# Patient Record
Sex: Male | Born: 1980 | Race: White | Hispanic: No | Smoking: Former smoker
Health system: Southern US, Community
[De-identification: ages and names within clinical notes are randomized; demographics above are authoritative.]

## PROBLEM LIST (undated history)

## (undated) DIAGNOSIS — F1010 Alcohol abuse, uncomplicated: Secondary | ICD-10-CM

## (undated) HISTORY — PX: SHOULDER ARTHROSCOPY: SHX128

---

## 2020-06-20 ENCOUNTER — Inpatient Hospital Stay (HOSPITAL_COMMUNITY): Payer: Self-pay

## 2020-06-20 ENCOUNTER — Inpatient Hospital Stay (HOSPITAL_COMMUNITY)
Admission: EM | Admit: 2020-06-20 | Discharge: 2020-06-30 | DRG: 082 | Disposition: A | Discharge status: Home / Self Care | Payer: Self-pay | Attending: Internal Medicine | Admitting: Internal Medicine

## 2020-06-20 ENCOUNTER — Emergency Department (HOSPITAL_COMMUNITY): Payer: Self-pay

## 2020-06-20 DIAGNOSIS — Y92481 Parking lot as the place of occurrence of the external cause: Secondary | ICD-10-CM

## 2020-06-20 DIAGNOSIS — K2101 Gastro-esophageal reflux disease with esophagitis, with bleeding: Secondary | ICD-10-CM | POA: Diagnosis present

## 2020-06-20 DIAGNOSIS — J9811 Atelectasis: Secondary | ICD-10-CM | POA: Diagnosis present

## 2020-06-20 DIAGNOSIS — D684 Acquired coagulation factor deficiency: Secondary | ICD-10-CM | POA: Diagnosis present

## 2020-06-20 DIAGNOSIS — Z781 Physical restraint status: Secondary | ICD-10-CM

## 2020-06-20 DIAGNOSIS — K7031 Alcoholic cirrhosis of liver with ascites: Secondary | ICD-10-CM | POA: Diagnosis present

## 2020-06-20 DIAGNOSIS — I864 Gastric varices: Secondary | ICD-10-CM | POA: Diagnosis present

## 2020-06-20 DIAGNOSIS — R402362 Coma scale, best motor response, obeys commands, at arrival to emergency department: Secondary | ICD-10-CM | POA: Diagnosis present

## 2020-06-20 DIAGNOSIS — Z720 Tobacco use: Secondary | ICD-10-CM

## 2020-06-20 DIAGNOSIS — Z79899 Other long term (current) drug therapy: Secondary | ICD-10-CM

## 2020-06-20 DIAGNOSIS — K2971 Gastritis, unspecified, with bleeding: Secondary | ICD-10-CM | POA: Diagnosis present

## 2020-06-20 DIAGNOSIS — Y9301 Activity, walking, marching and hiking: Secondary | ICD-10-CM | POA: Diagnosis present

## 2020-06-20 DIAGNOSIS — R791 Abnormal coagulation profile: Secondary | ICD-10-CM

## 2020-06-20 DIAGNOSIS — D61818 Other pancytopenia: Secondary | ICD-10-CM | POA: Diagnosis present

## 2020-06-20 DIAGNOSIS — K3189 Other diseases of stomach and duodenum: Secondary | ICD-10-CM | POA: Diagnosis present

## 2020-06-20 DIAGNOSIS — F10229 Alcohol dependence with intoxication, unspecified: Secondary | ICD-10-CM | POA: Diagnosis present

## 2020-06-20 DIAGNOSIS — W1839XA Other fall on same level, initial encounter: Secondary | ICD-10-CM | POA: Diagnosis present

## 2020-06-20 DIAGNOSIS — K7040 Alcoholic hepatic failure without coma: Secondary | ICD-10-CM | POA: Diagnosis present

## 2020-06-20 DIAGNOSIS — F10239 Alcohol dependence with withdrawal, unspecified: Secondary | ICD-10-CM | POA: Diagnosis not present

## 2020-06-20 DIAGNOSIS — R471 Dysarthria and anarthria: Secondary | ICD-10-CM | POA: Diagnosis present

## 2020-06-20 DIAGNOSIS — R17 Unspecified jaundice: Secondary | ICD-10-CM

## 2020-06-20 DIAGNOSIS — F10939 Alcohol use, unspecified with withdrawal, unspecified: Secondary | ICD-10-CM

## 2020-06-20 DIAGNOSIS — Z9181 History of falling: Secondary | ICD-10-CM

## 2020-06-20 DIAGNOSIS — R402252 Coma scale, best verbal response, oriented, at arrival to emergency department: Secondary | ICD-10-CM | POA: Diagnosis present

## 2020-06-20 DIAGNOSIS — R4189 Other symptoms and signs involving cognitive functions and awareness: Secondary | ICD-10-CM | POA: Diagnosis present

## 2020-06-20 DIAGNOSIS — R569 Unspecified convulsions: Secondary | ICD-10-CM

## 2020-06-20 DIAGNOSIS — K7011 Alcoholic hepatitis with ascites: Secondary | ICD-10-CM | POA: Diagnosis present

## 2020-06-20 DIAGNOSIS — F1010 Alcohol abuse, uncomplicated: Secondary | ICD-10-CM

## 2020-06-20 DIAGNOSIS — S06349A Traumatic hemorrhage of right cerebrum with loss of consciousness of unspecified duration, initial encounter: Principal | ICD-10-CM | POA: Diagnosis present

## 2020-06-20 DIAGNOSIS — Y900 Blood alcohol level of less than 20 mg/100 ml: Secondary | ICD-10-CM | POA: Diagnosis present

## 2020-06-20 DIAGNOSIS — R402142 Coma scale, eyes open, spontaneous, at arrival to emergency department: Secondary | ICD-10-CM | POA: Diagnosis present

## 2020-06-20 DIAGNOSIS — R278 Other lack of coordination: Secondary | ICD-10-CM | POA: Diagnosis present

## 2020-06-20 DIAGNOSIS — K7460 Unspecified cirrhosis of liver: Secondary | ICD-10-CM

## 2020-06-20 DIAGNOSIS — S61411A Laceration without foreign body of right hand, initial encounter: Secondary | ICD-10-CM | POA: Diagnosis present

## 2020-06-20 DIAGNOSIS — M549 Dorsalgia, unspecified: Secondary | ICD-10-CM | POA: Diagnosis present

## 2020-06-20 DIAGNOSIS — I8510 Secondary esophageal varices without bleeding: Secondary | ICD-10-CM | POA: Diagnosis present

## 2020-06-20 DIAGNOSIS — J9600 Acute respiratory failure, unspecified whether with hypoxia or hypercapnia: Secondary | ICD-10-CM | POA: Diagnosis not present

## 2020-06-20 DIAGNOSIS — J9601 Acute respiratory failure with hypoxia: Secondary | ICD-10-CM

## 2020-06-20 DIAGNOSIS — G9349 Other encephalopathy: Secondary | ICD-10-CM | POA: Diagnosis present

## 2020-06-20 DIAGNOSIS — S0101XA Laceration without foreign body of scalp, initial encounter: Secondary | ICD-10-CM | POA: Diagnosis present

## 2020-06-20 DIAGNOSIS — E876 Hypokalemia: Secondary | ICD-10-CM | POA: Diagnosis not present

## 2020-06-20 DIAGNOSIS — G4089 Other seizures: Secondary | ICD-10-CM | POA: Diagnosis present

## 2020-06-20 DIAGNOSIS — D62 Acute posthemorrhagic anemia: Secondary | ICD-10-CM

## 2020-06-20 DIAGNOSIS — K264 Chronic or unspecified duodenal ulcer with hemorrhage: Secondary | ICD-10-CM | POA: Diagnosis present

## 2020-06-20 DIAGNOSIS — I619 Nontraumatic intracerebral hemorrhage, unspecified: Secondary | ICD-10-CM | POA: Diagnosis present

## 2020-06-20 DIAGNOSIS — Z20822 Contact with and (suspected) exposure to covid-19: Secondary | ICD-10-CM | POA: Diagnosis present

## 2020-06-20 DIAGNOSIS — K7010 Alcoholic hepatitis without ascites: Secondary | ICD-10-CM

## 2020-06-20 DIAGNOSIS — I629 Nontraumatic intracranial hemorrhage, unspecified: Secondary | ICD-10-CM

## 2020-06-20 DIAGNOSIS — G9341 Metabolic encephalopathy: Secondary | ICD-10-CM | POA: Diagnosis present

## 2020-06-20 DIAGNOSIS — D696 Thrombocytopenia, unspecified: Secondary | ICD-10-CM

## 2020-06-20 DIAGNOSIS — Z87891 Personal history of nicotine dependence: Secondary | ICD-10-CM

## 2020-06-20 DIAGNOSIS — K766 Portal hypertension: Secondary | ICD-10-CM | POA: Diagnosis present

## 2020-06-20 DIAGNOSIS — E871 Hypo-osmolality and hyponatremia: Secondary | ICD-10-CM | POA: Diagnosis not present

## 2020-06-20 DIAGNOSIS — S062XAA Diffuse traumatic brain injury with loss of consciousness status unknown, initial encounter: Secondary | ICD-10-CM

## 2020-06-20 DIAGNOSIS — K269 Duodenal ulcer, unspecified as acute or chronic, without hemorrhage or perforation: Secondary | ICD-10-CM

## 2020-06-20 HISTORY — DX: Alcohol abuse, uncomplicated: F10.10

## 2020-06-20 LAB — URINALYSIS, ROUTINE W REFLEX MICROSCOPIC
Bilirubin Urine: NEGATIVE
Glucose, UA: NEGATIVE mg/dL
Ketones, ur: NEGATIVE mg/dL
Leukocytes,Ua: NEGATIVE
Nitrite: NEGATIVE
Protein, ur: 100 mg/dL — AB
Specific Gravity, Urine: 1.016 (ref 1.005–1.030)
pH: 5 (ref 5.0–8.0)

## 2020-06-20 LAB — COMPREHENSIVE METABOLIC PANEL
ALT: 27 U/L (ref 0–44)
AST: 156 U/L — ABNORMAL HIGH (ref 15–41)
Albumin: 3.1 g/dL — ABNORMAL LOW (ref 3.5–5.0)
Alkaline Phosphatase: 179 U/L — ABNORMAL HIGH (ref 38–126)
Anion gap: 25 — ABNORMAL HIGH (ref 5–15)
BUN: 6 mg/dL (ref 6–20)
CO2: 9 mmol/L — ABNORMAL LOW (ref 22–32)
Calcium: 8 mg/dL — ABNORMAL LOW (ref 8.9–10.3)
Chloride: 101 mmol/L (ref 98–111)
Creatinine, Ser: 1.14 mg/dL (ref 0.61–1.24)
GFR calc Af Amer: >60 mL/min (ref >60)
GFR calc non Af Amer: >60 mL/min (ref >60)
Glucose, Bld: 105 mg/dL — ABNORMAL HIGH (ref 70–99)
Potassium: 3.6 mmol/L (ref 3.5–5.1)
Sodium: 135 mmol/L (ref 135–145)
Total Bilirubin: 6.2 mg/dL — ABNORMAL HIGH (ref 0.3–1.2)
Total Protein: 7.2 g/dL (ref 6.5–8.1)

## 2020-06-20 LAB — PROTIME-INR
INR: 1.7 — ABNORMAL HIGH (ref 0.8–1.2)
Prothrombin Time: 19 seconds — ABNORMAL HIGH (ref 11.4–15.2)

## 2020-06-20 LAB — CBC WITH DIFFERENTIAL/PLATELET
Abs Immature Granulocytes: 0.21 10*3/uL — ABNORMAL HIGH (ref 0.00–0.07)
Basophils Absolute: 0.1 10*3/uL (ref 0.0–0.1)
Basophils Relative: 1 %
Eosinophils Absolute: 0.3 10*3/uL (ref 0.0–0.5)
Eosinophils Relative: 3 %
HCT: 30.5 % — ABNORMAL LOW (ref 39.0–52.0)
Hemoglobin: 8.6 g/dL — ABNORMAL LOW (ref 13.0–17.0)
Immature Granulocytes: 2 %
Lymphocytes Relative: 38 %
Lymphs Abs: 4.5 10*3/uL — ABNORMAL HIGH (ref 0.7–4.0)
MCH: 23.9 pg — ABNORMAL LOW (ref 26.0–34.0)
MCHC: 28.2 g/dL — ABNORMAL LOW (ref 30.0–36.0)
MCV: 84.7 fL (ref 80.0–100.0)
Monocytes Absolute: 1.5 10*3/uL — ABNORMAL HIGH (ref 0.1–1.0)
Monocytes Relative: 13 %
Neutro Abs: 5.2 10*3/uL (ref 1.7–7.7)
Neutrophils Relative %: 43 %
Platelets: 41 10*3/uL — ABNORMAL LOW (ref 150–400)
RBC: 3.6 MIL/uL — ABNORMAL LOW (ref 4.22–5.81)
RDW: 20.2 % — ABNORMAL HIGH (ref 11.5–15.5)
WBC: 11.8 10*3/uL — ABNORMAL HIGH (ref 4.0–10.5)
nRBC: 0.3 % — ABNORMAL HIGH (ref 0.0–0.2)

## 2020-06-20 LAB — RAPID URINE DRUG SCREEN, HOSP PERFORMED
Amphetamines: NOT DETECTED
Barbiturates: NOT DETECTED
Benzodiazepines: POSITIVE — AB
Cocaine: NOT DETECTED
Opiates: NOT DETECTED
Tetrahydrocannabinol: NOT DETECTED

## 2020-06-20 LAB — GLUCOSE, CAPILLARY
Glucose-Capillary: 105 mg/dL — ABNORMAL HIGH (ref 70–99)
Glucose-Capillary: 104 mg/dL — ABNORMAL HIGH (ref 70–99)

## 2020-06-20 LAB — SARS CORONAVIRUS 2 BY RT PCR (HOSPITAL ORDER, PERFORMED IN ~~LOC~~ HOSPITAL LAB): SARS Coronavirus 2: NEGATIVE

## 2020-06-20 LAB — ABO/RH: ABO/RH(D): O POS

## 2020-06-20 LAB — CBG MONITORING, ED: Glucose-Capillary: 97 mg/dL (ref 70–99)

## 2020-06-20 LAB — MRSA PCR SCREENING: MRSA by PCR: NEGATIVE

## 2020-06-20 LAB — ETHANOL: Alcohol, Ethyl (B): 15 mg/dL — ABNORMAL HIGH (ref <10)

## 2020-06-20 IMAGING — CT CT HEAD W/O CM
4 series · 15 of 47 positions shown, 17 images · non-contrast
Comparison: None.

CLINICAL DATA: Seizure and head/cervical spine injury.

EXAM:
CT HEAD WITHOUT CONTRAST
CT CERVICAL SPINE WITHOUT CONTRAST
TECHNIQUE: Multidetector CT imaging of the head and cervical spine was
performed following the standard protocol without intravenous
contrast. Multiplanar CT image reconstructions of the cervical spine
were also generated.

[Series 1: head bone · axial · 0.45mm/px · z∈[-298,-280]mm · 2 of 87 slices shown]
[im 9/87  bone]
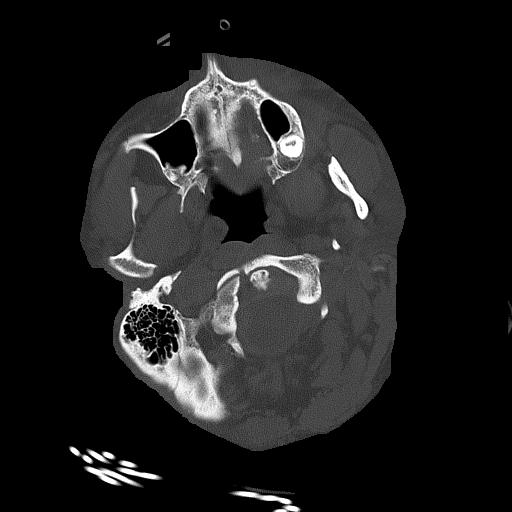
[im 18/87  bone]
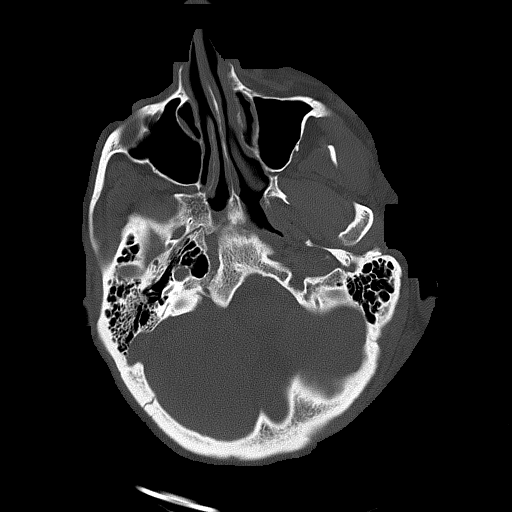

[Series 4: head without cor · coronal · non-contrast · 0.39mm/px · 3 of 80 slices shown]
[im 27/80  brain]
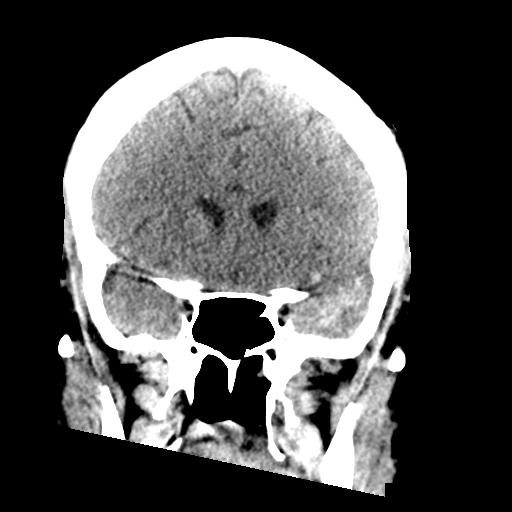
[im 36/80  brain]
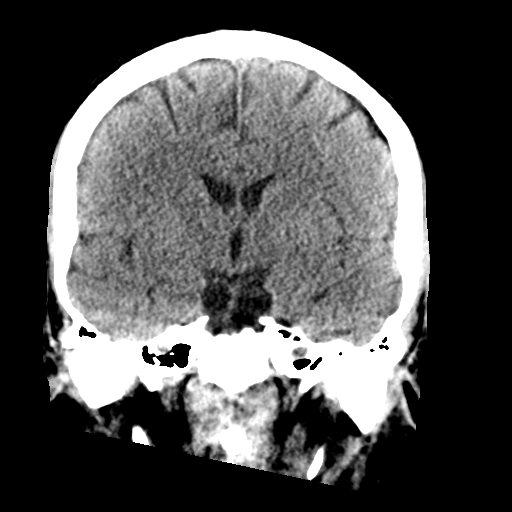
[im 44/80  brain]
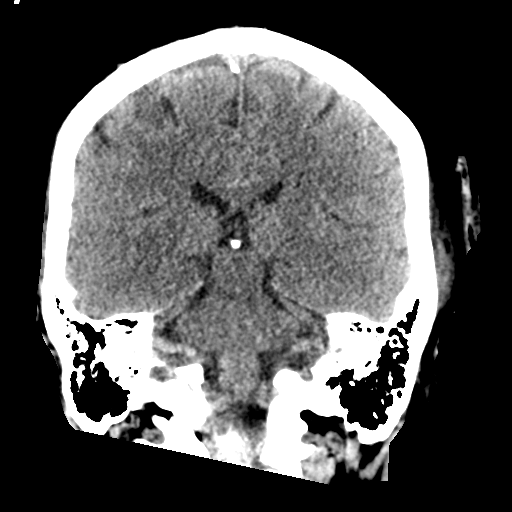

[Series 5: head without sag · sagittal · non-contrast · 0.37mm/px · 3 of 59 slices shown]
[im 25/59  brain]
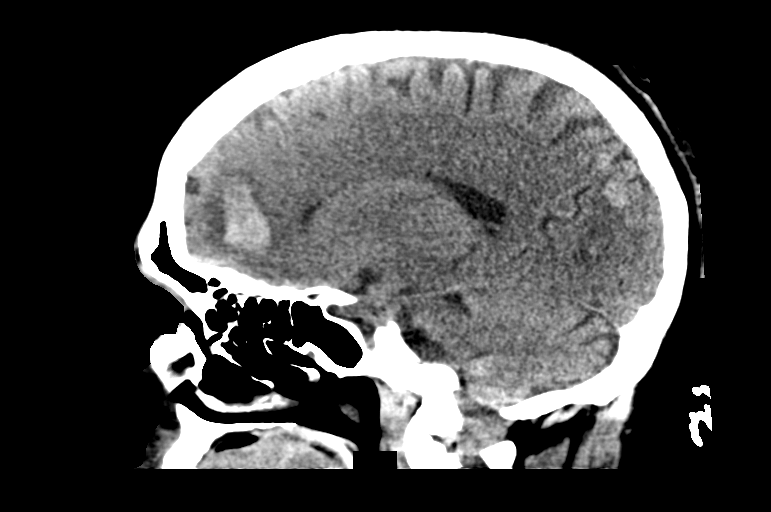
[im 30/59  brain]
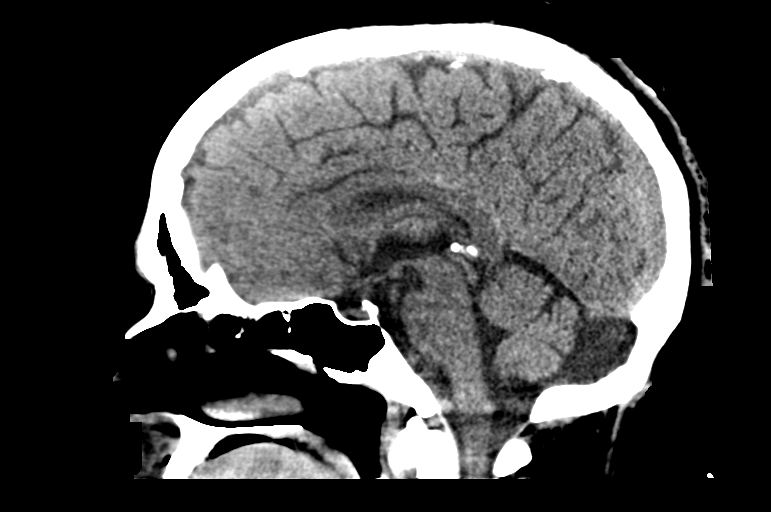
[im 34/59  brain]
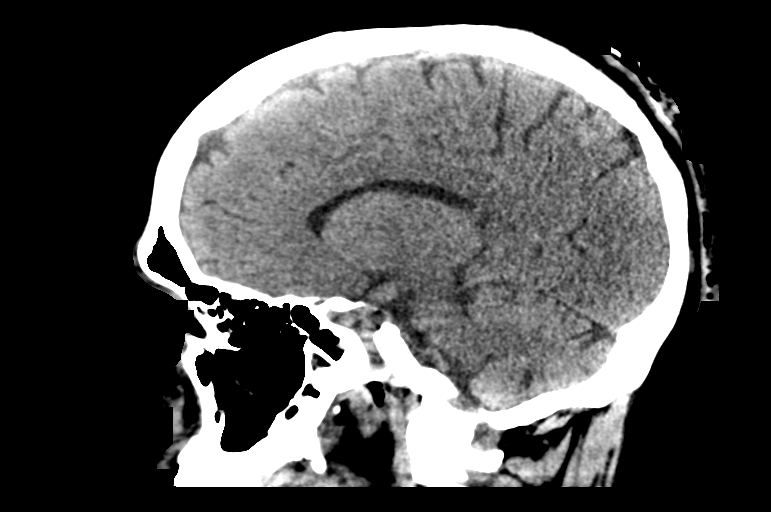

[Series 6: head without · axial · non-contrast · 0.45mm/px · z∈[-294,-169]mm · 7 of 35 slices shown, 9 images]
[im 5/35  brain]
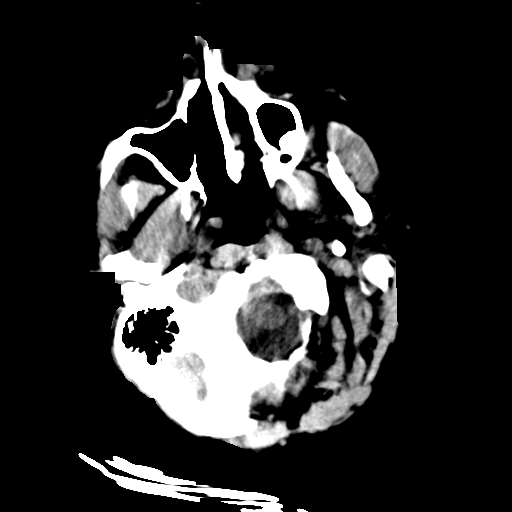
[im 5/35  bone]
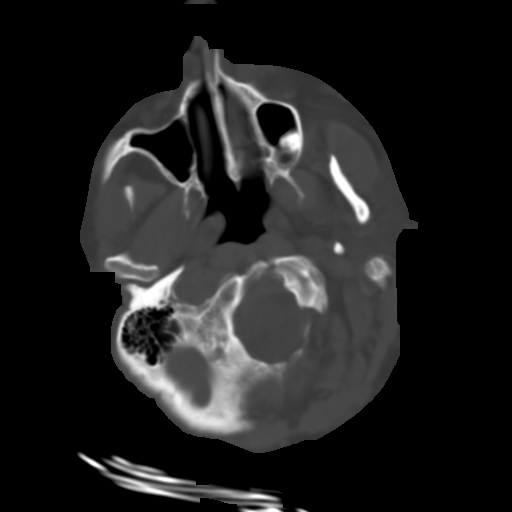
[im 9/35  brain]
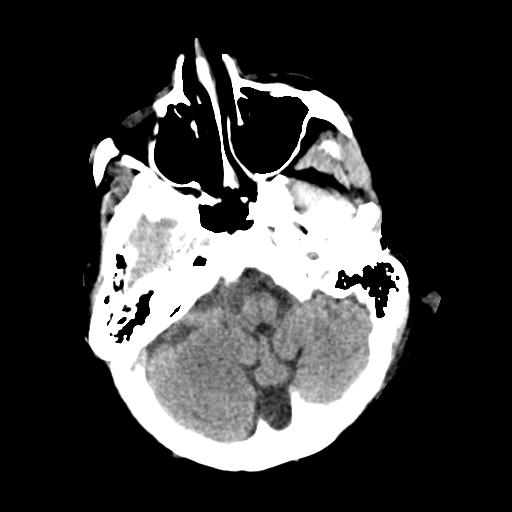
[im 13/35  brain]
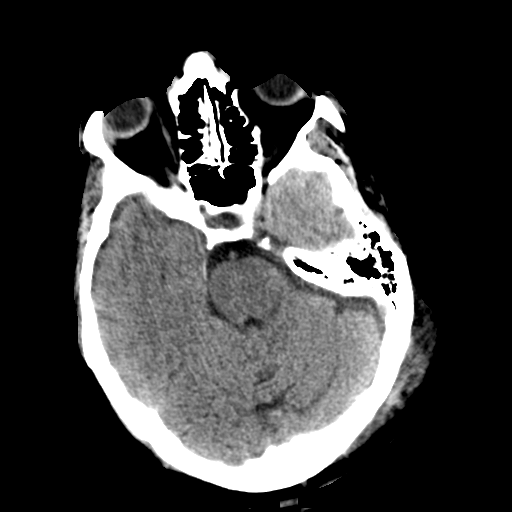
[im 18/35  brain]
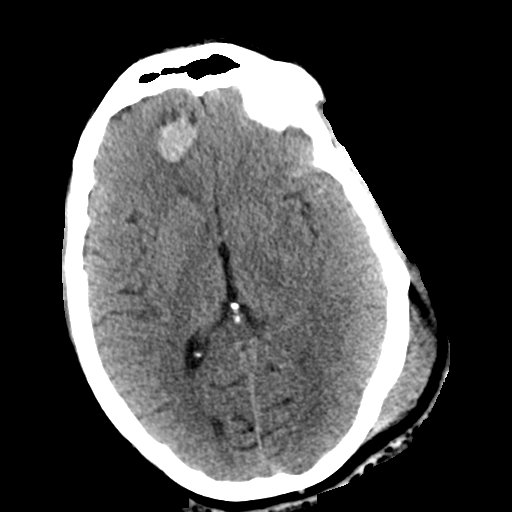
[im 22/35  brain]
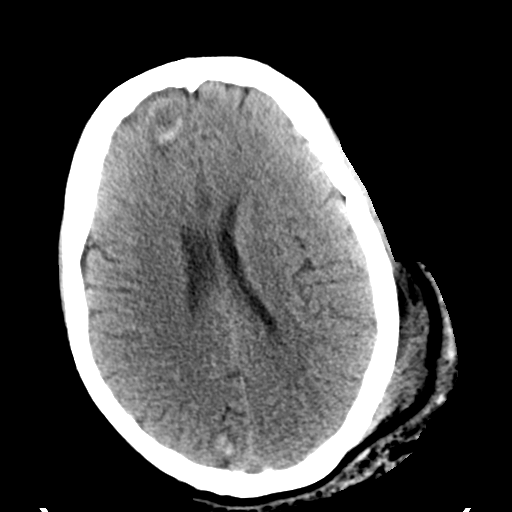
[im 22/35  bone]
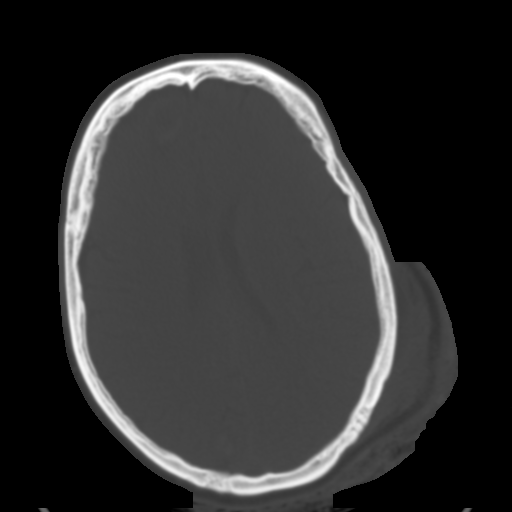
[im 26/35  brain]
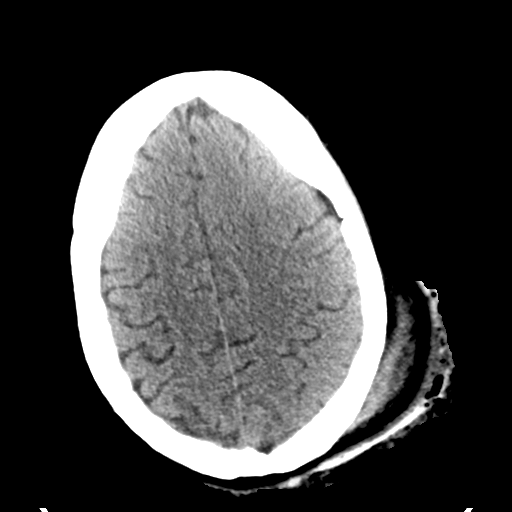
[im 30/35  brain]
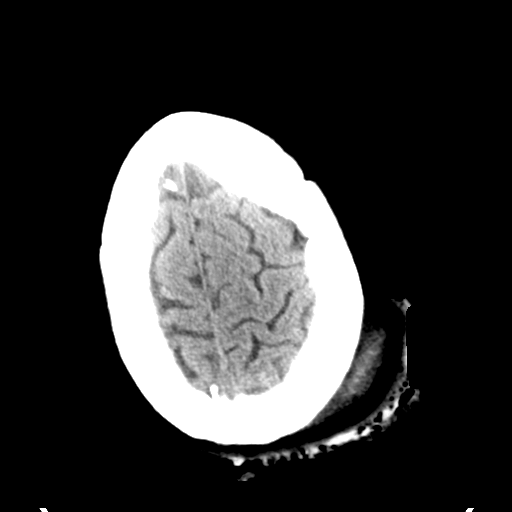

[15 of 47 positions shown; findings below may reference images not displayed]

FINDINGS: CT HEAD FINDINGS

Brain: Moderate volume acute intraparenchymal hemorrhage in the
anterior aspect of the right frontal lobe measures 2.8 x 2.8 (series
6, image 19) x 3.7 cm (series 4, image 17). This exerts local mass
effect, however there is no significant midline shift. Additional
small volume acute intraparenchymal hemorrhage is seen in the
anterior left temporal lobe and superomedial aspect of the right
occipital lobe, both with small volume associated subarachnoid
hemorrhage. There is at least one focus of subarachnoid hemorrhage
overlying the left occipital/parietal lobe (series 5, image 37). The
ventricles are normal size, shape and morphology. Basilar cisterns
are patent.

Vascular: No hyperdense vessel or unexpected calcification.

Skull: Normal. Negative for fracture or focal lesion.

Sinuses/Orbits: No acute finding.

Other: There is a large scalp hematoma overlying the left parietal
bone.

CT CERVICAL SPINE FINDINGS

Alignment: Normal.

Skull base and vertebrae: Motion artifact limits the evaluation for
subtle fracture, however given this limitation no acute osseous
injury is identified. No primary bone lesion or focal pathologic
process.

Soft tissues and spinal canal: No prevertebral fluid or swelling. No
visible canal hematoma.

Disc levels:  Multilevel degenerative disc disease.

Upper chest: Negative.

Other: None.
IMPRESSION: 1. Moderate volume acute intraparenchymal hemorrhage in the anterior
aspect of the right frontal lobe exerting local mass effect, however
there is no significant midline shift.
2. Additional small volume acute intraparenchymal hemorrhage in the
anterior left temporal lobe and superomedial aspect of the right
occipital lobe, both with small volume associated subarachnoid
hemorrhage.
3. Motion artifact limits the evaluation for subtle fracture in the
cervical spine, however given this limitation no acute osseous
injury is identified.

These results were called by telephone at the time of interpretation
on [DATE] at [DATE] to provider LABELLE SALHA , who verbally
acknowledged these results.

## 2020-06-20 IMAGING — CT CT ABD-PELV W/ CM
2 of 4 series · 15 of 46 positions shown, 17 images · IV contrast (Omni 300)
Comparison: [DATE]

CLINICAL DATA: Cirrhosis, possible portal vein thrombosis,
gallbladder wall thickening

EXAM:
CT ABDOMEN AND PELVIS WITH CONTRAST
TECHNIQUE: Multidetector CT imaging of the abdomen and pelvis was performed
using the standard protocol following bolus administration of
intravenous contrast.
CONTRAST:  100mL OMNIPAQUE IOHEXOL 300 MG/ML  SOLN

[Series 3: a/p w/ 5mm · axial · 0.78mm/px · z∈[+845,+1325]mm · 12 of 114 slices shown, 14 images]
[im 9/114  soft-tissue]
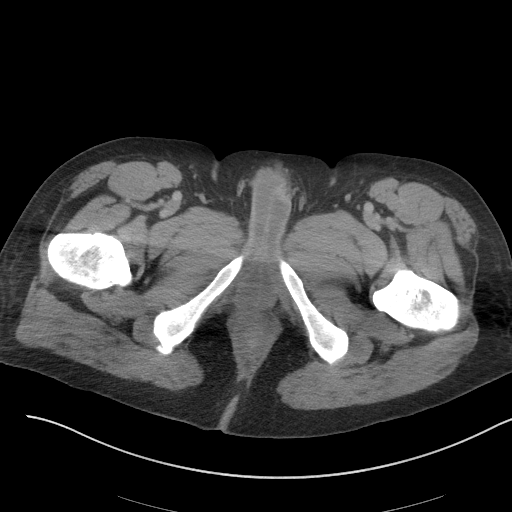
[im 9/114  bone]
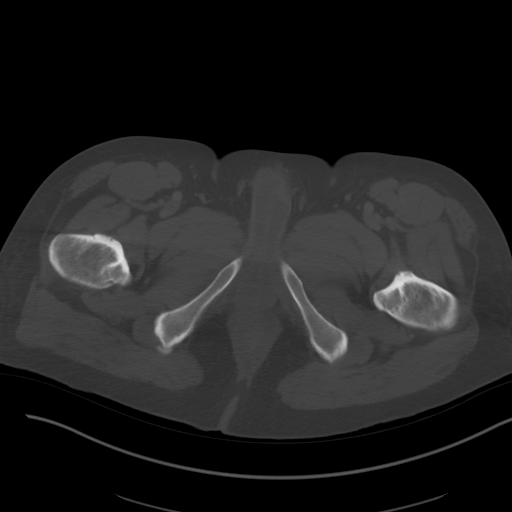
[im 17/114  soft-tissue]
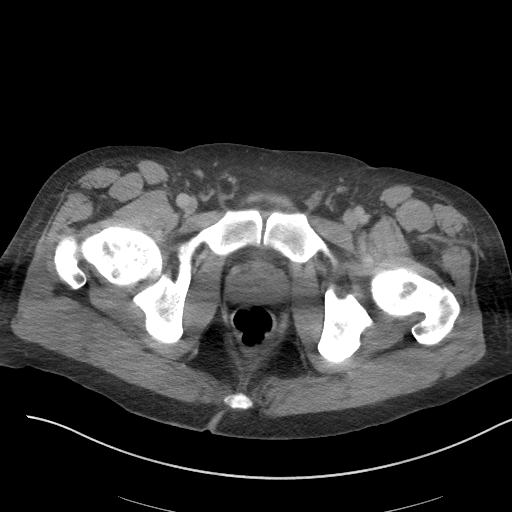
[im 26/114  soft-tissue]
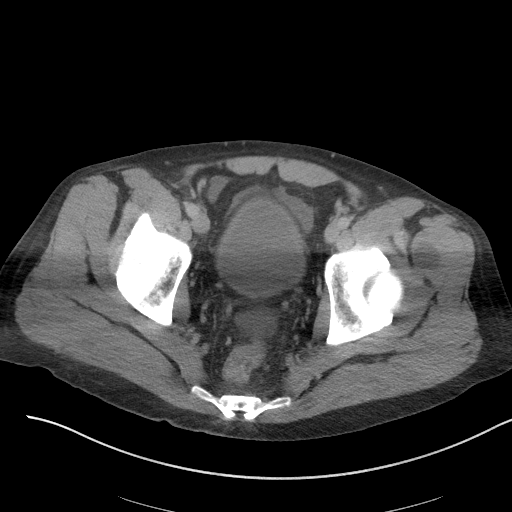
[im 34/114  soft-tissue]
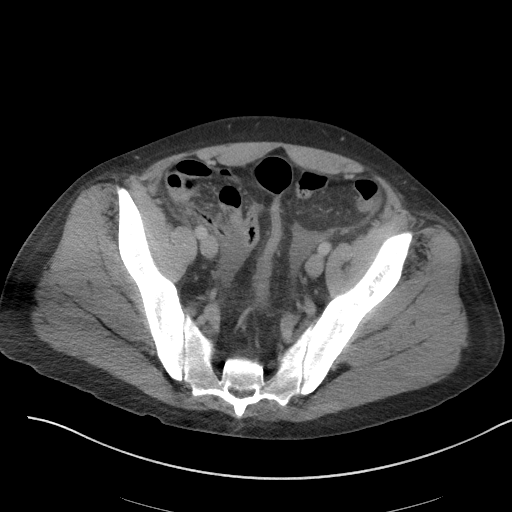
[im 42/114  soft-tissue]
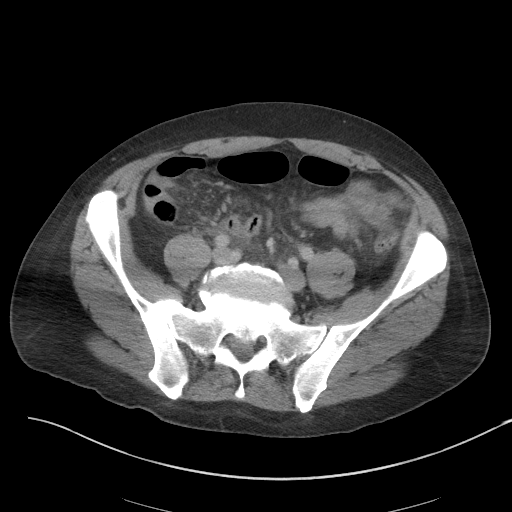
[im 51/114  soft-tissue]
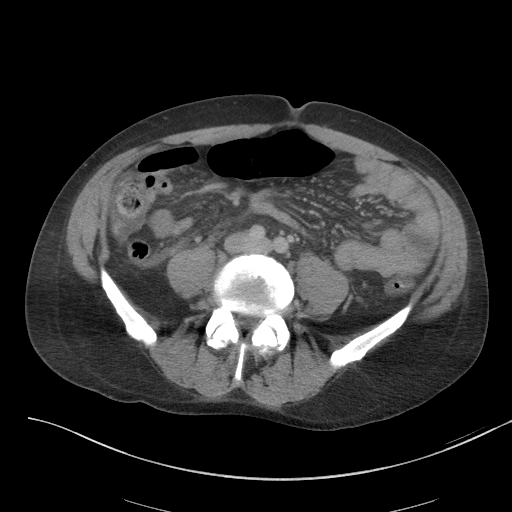
[im 63/114  soft-tissue]
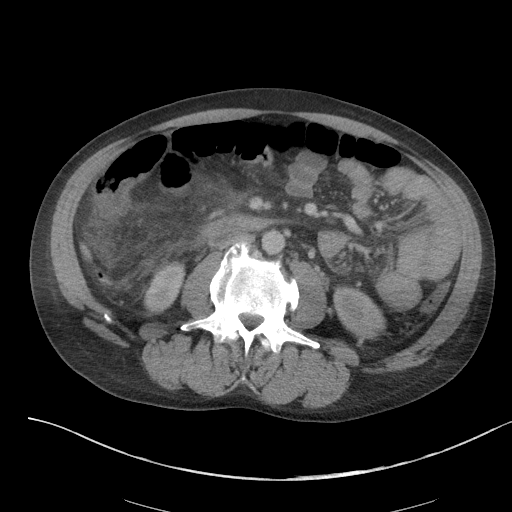
[im 72/114  soft-tissue]
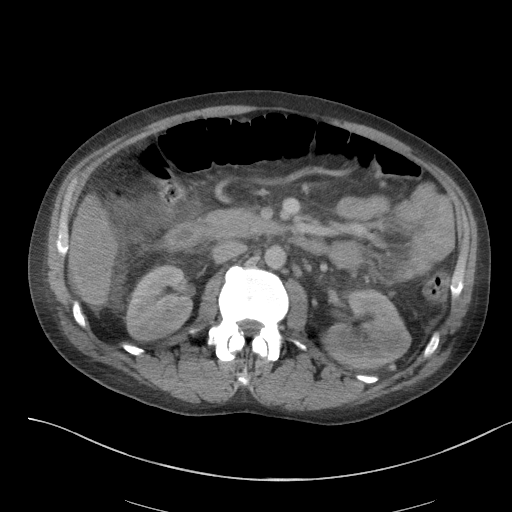
[im 80/114  soft-tissue]
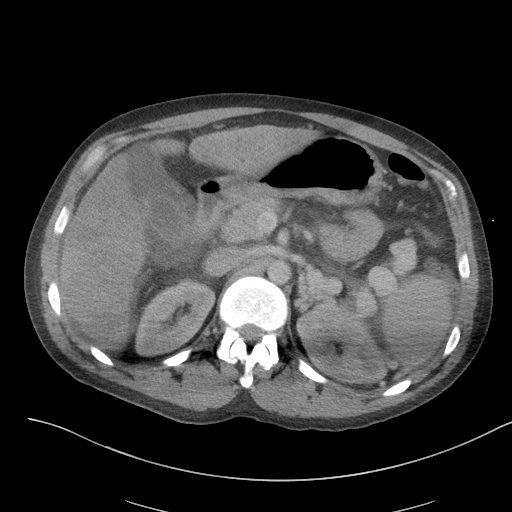
[im 80/114  bone]
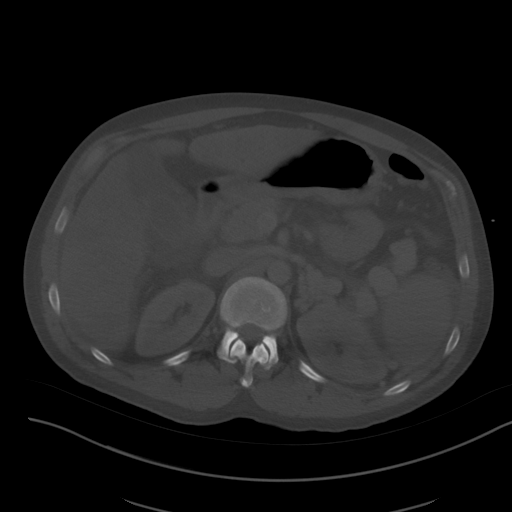
[im 88/114  soft-tissue]
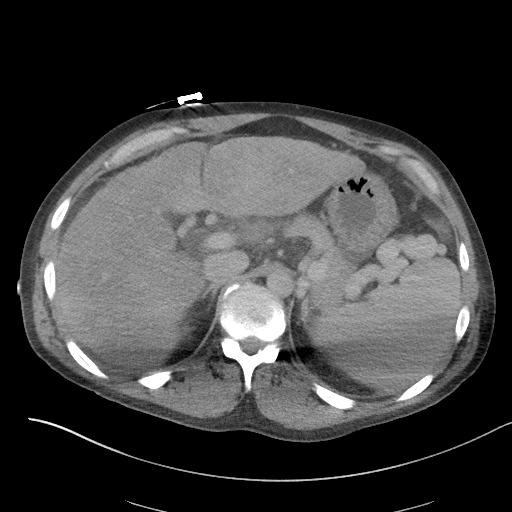
[im 97/114  soft-tissue]
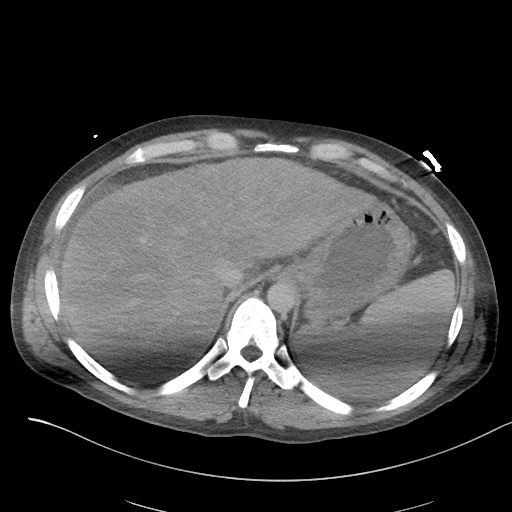
[im 105/114  soft-tissue]
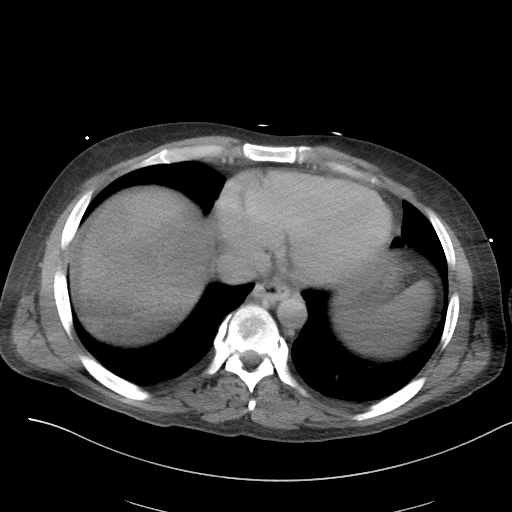

[Series 6: a/p w/ cor · coronal · 0.79mm/px · 3 of 151 slices shown]
[im 51/151  soft-tissue]
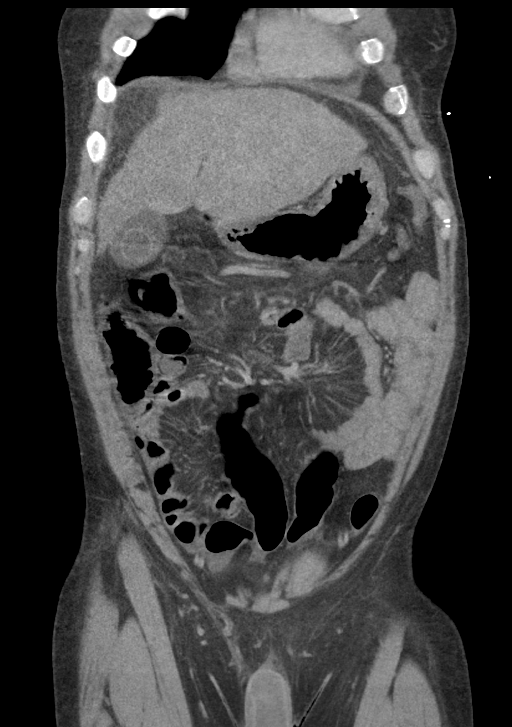
[im 67/151  soft-tissue]
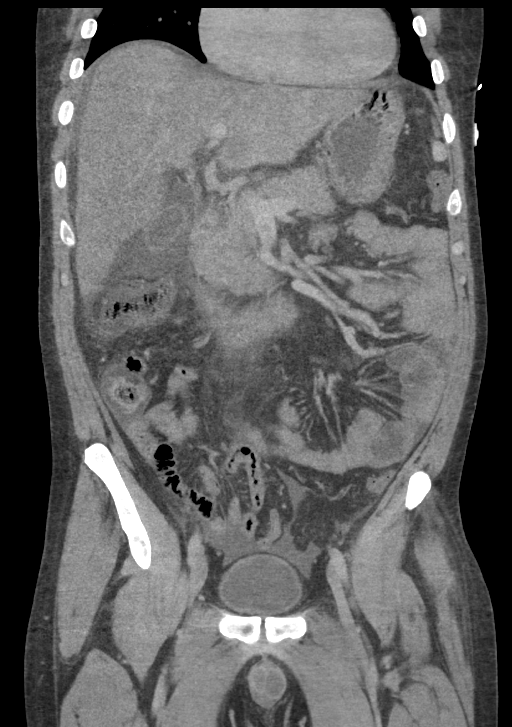
[im 84/151  soft-tissue]
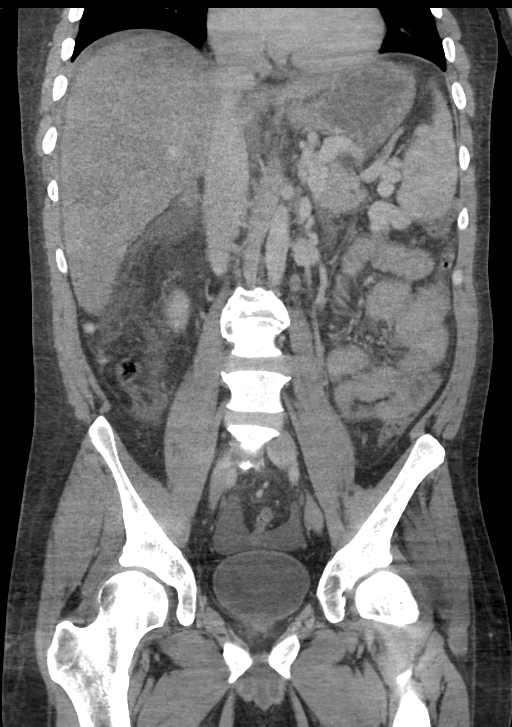

[15 of 46 positions shown; findings below may reference images not displayed]

FINDINGS: Lower chest: No acute pleural or parenchymal lung disease.

Hepatobiliary: Diffuse heterogeneity of the liver parenchyma, with
nodularity of the liver capsule, compatible with known cirrhosis. No
intrahepatic biliary duct dilation. Gallbladder is moderately
distended, with pericholecystic fluid likely due to cirrhosis. No
evidence of gallbladder wall thickening or calcified gallstones.

Pancreas: Unremarkable. No pancreatic ductal dilatation or
surrounding inflammatory changes.

Spleen: Spleen is enlarged consistent with portal venous
hypertension, measuring 16 cm in craniocaudal length.

Adrenals/Urinary Tract: 3 mm nonobstructing calculus lower pole left
kidney. Right kidney is unremarkable. The bladder is moderately
distended with no filling defects. The adrenals are normal.

Stomach/Bowel: No bowel obstruction or ileus. There is nonspecific
wall thickening within a segment of the ascending colon near the
hepatic flexure. This could be due to under distension,
inflammation/infection, or wall thickening due to adjacent right
upper quadrant free fluid. There is a normal appendix.

Vascular/Lymphatic: The aorta is unremarkable without
atherosclerosis.

The portal vein, splenic vein, and superior mesenteric vein opacify
normally. No evidence of portal venous thrombosis. There is evidence
of significant portal venous hypertension, with splenomegaly and
marked splenic and gastric varices.

No pathologic adenopathy within the abdomen or pelvis.

Reproductive: Prostate is unremarkable.

Other: Small volume ascites greatest in the right upper quadrant and
lower pelvis. No free intraperitoneal gas. No abdominal wall hernia.

Musculoskeletal: No acute or destructive bony lesions. Reconstructed
images demonstrate no additional findings.
IMPRESSION: 1. Normal opacification of the portal vein, SMV, and splenic vein.
No evidence of portal vein thrombosis. Sequelae of portal venous
hypertension are noted, with splenomegaly and splenic/gastric
varices.
2. Cirrhosis.  No focal liver abnormality on this single phase exam.
3. Distended gallbladder, with pericholecystic free fluid likely due
to underlying cirrhosis. No evidence of cholecystitis or
cholelithiasis.
4. Small volume ascites.
5. Nonspecific wall thickening at the hepatic flexure of the colon,
which could be due to inflammatory/infectious colitis, under
distension, or wall thickening due to adjacent free fluid in the
right upper quadrant.

## 2020-06-20 IMAGING — CT CT CERVICAL SPINE W/O CM
3 of 5 series · 12 of 35 positions shown, 14 images · non-contrast
Comparison: None.

CLINICAL DATA: Seizure and head/cervical spine injury.

EXAM:
CT HEAD WITHOUT CONTRAST
CT CERVICAL SPINE WITHOUT CONTRAST
TECHNIQUE: Multidetector CT imaging of the head and cervical spine was
performed following the standard protocol without intravenous
contrast. Multiplanar CT image reconstructions of the cervical spine
were also generated.

[Series 3: c_spine 2.0 st · axial · 0.35mm/px · z∈[-458,-304]mm · 4 of 129 slices shown, 5 images]
[im 26/129  soft-tissue]
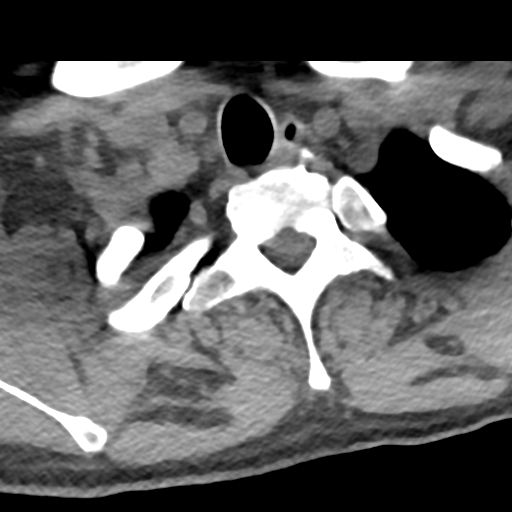
[im 26/129  bone]
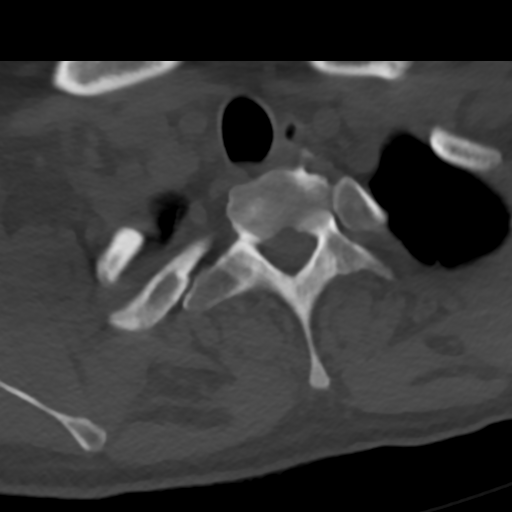
[im 52/129  bone]
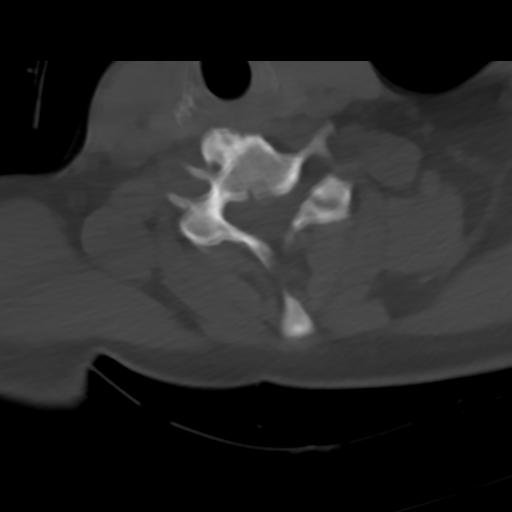
[im 77/129  bone]
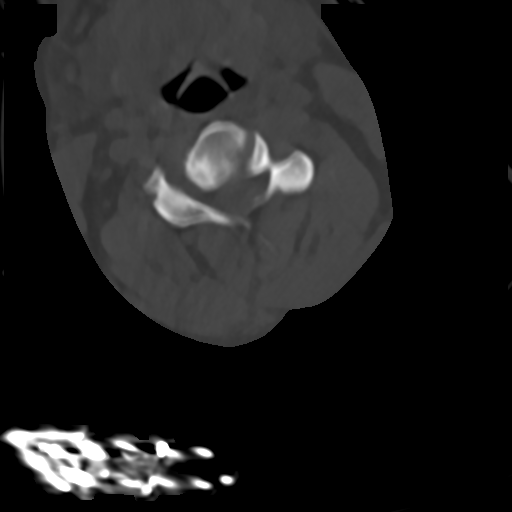
[im 103/129  bone]
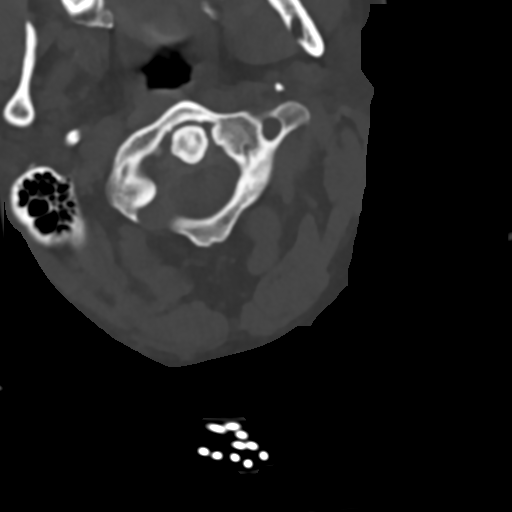

[Series 9: c_spine 2.0 sag bone · sagittal · 0.38mm/px · 5 of 61 slices shown, 6 images]
[im 21/61  bone]
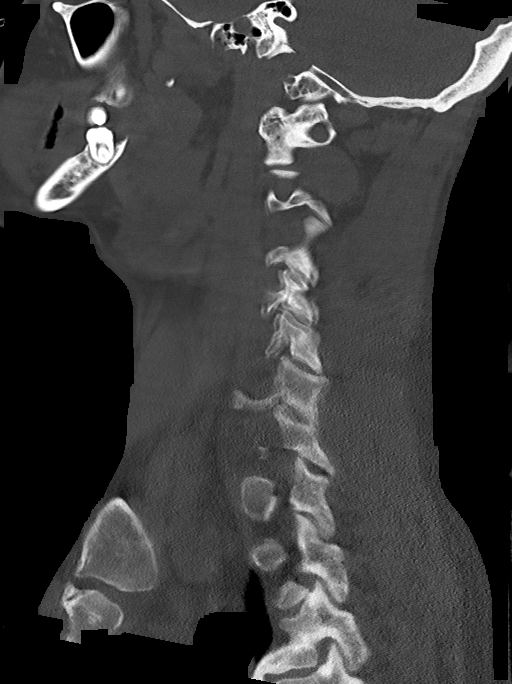
[im 26/61  bone]
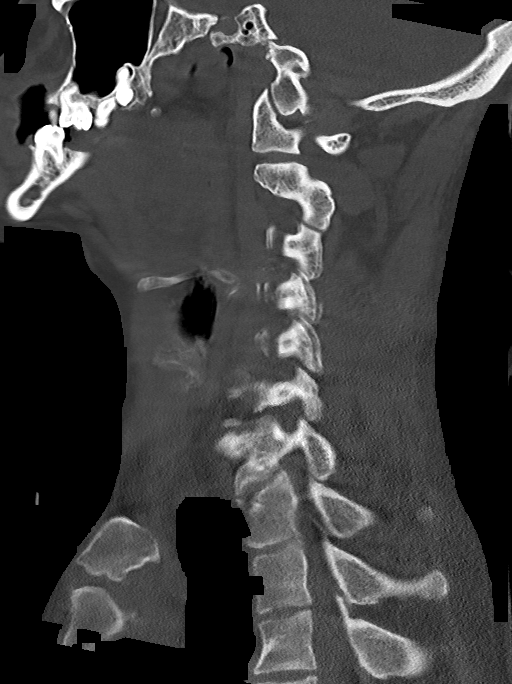
[im 31/61  soft-tissue]
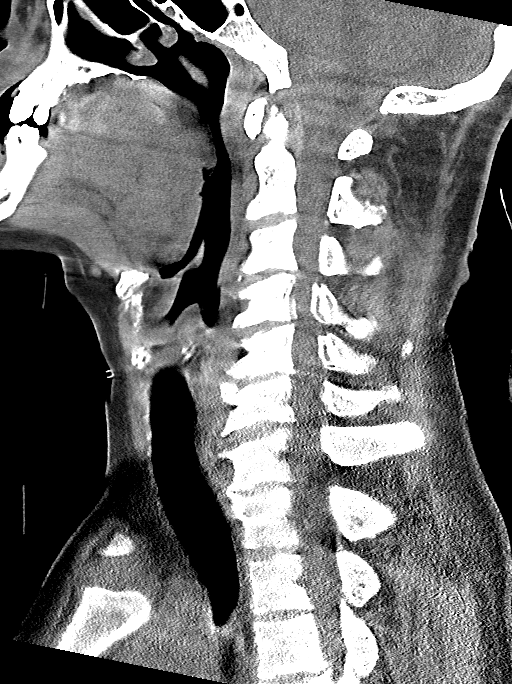
[im 31/61  bone]
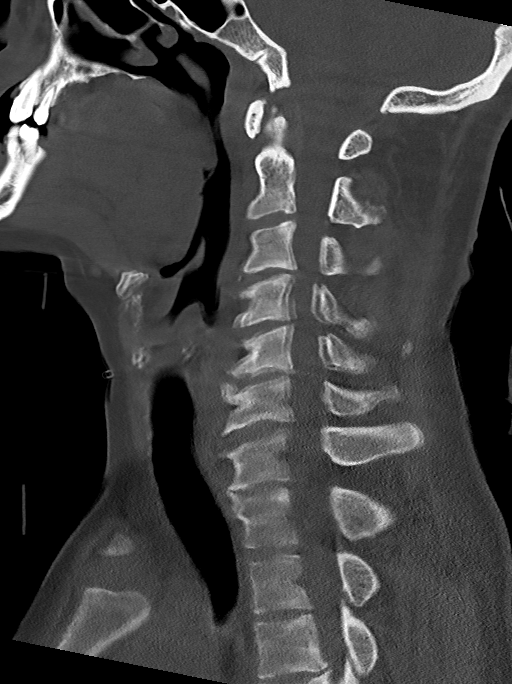
[im 36/61  bone]
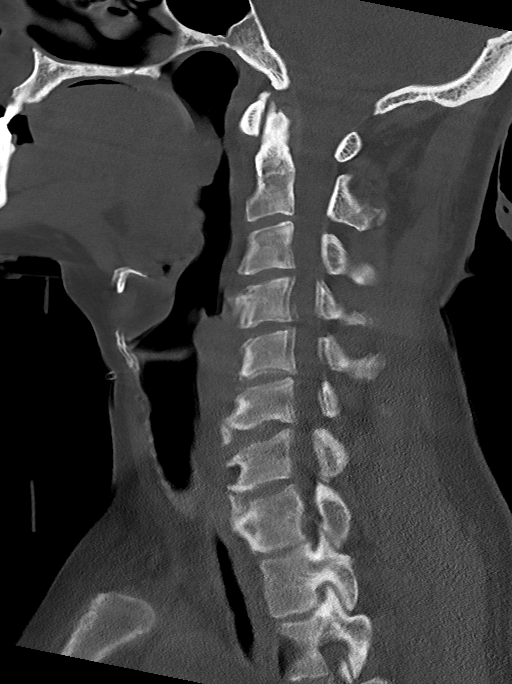
[im 41/61  bone]
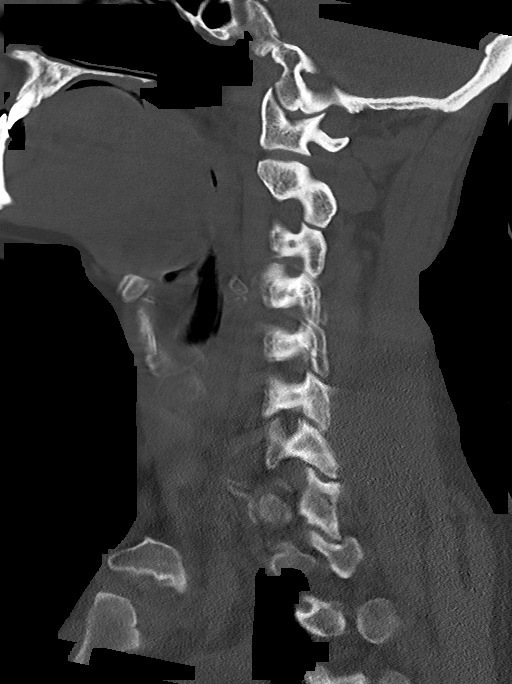

[Series 10: c_spine 2.0 cor bone · coronal · 0.38mm/px · 3 of 67 slices shown]
[im 14/67  bone]
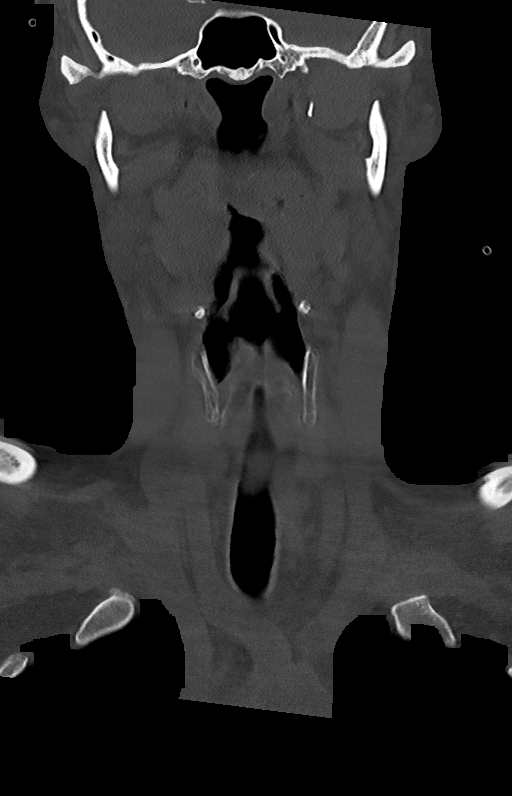
[im 27/67  bone]
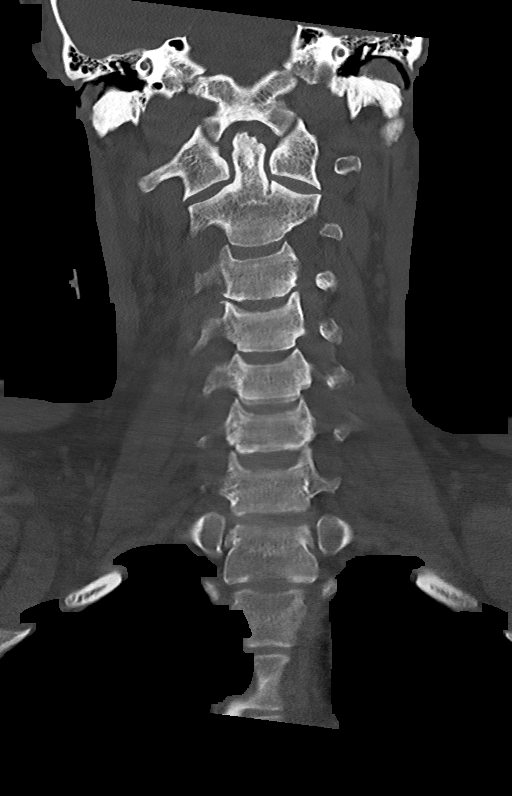
[im 40/67  bone]
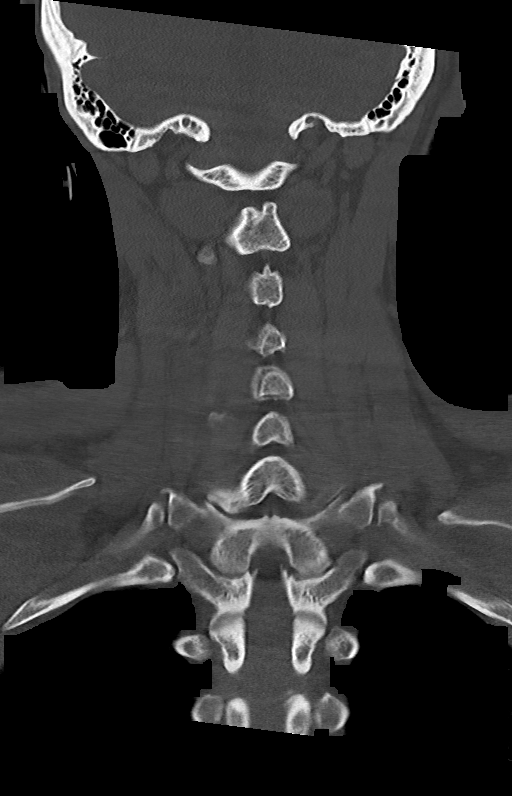

[12 of 35 positions shown; findings below may reference images not displayed]

FINDINGS: CT HEAD FINDINGS

Brain: Moderate volume acute intraparenchymal hemorrhage in the
anterior aspect of the right frontal lobe measures 2.8 x 2.8 (series
6, image 19) x 3.7 cm (series 4, image 17). This exerts local mass
effect, however there is no significant midline shift. Additional
small volume acute intraparenchymal hemorrhage is seen in the
anterior left temporal lobe and superomedial aspect of the right
occipital lobe, both with small volume associated subarachnoid
hemorrhage. There is at least one focus of subarachnoid hemorrhage
overlying the left occipital/parietal lobe (series 5, image 37). The
ventricles are normal size, shape and morphology. Basilar cisterns
are patent.

Vascular: No hyperdense vessel or unexpected calcification.

Skull: Normal. Negative for fracture or focal lesion.

Sinuses/Orbits: No acute finding.

Other: There is a large scalp hematoma overlying the left parietal
bone.

CT CERVICAL SPINE FINDINGS

Alignment: Normal.

Skull base and vertebrae: Motion artifact limits the evaluation for
subtle fracture, however given this limitation no acute osseous
injury is identified. No primary bone lesion or focal pathologic
process.

Soft tissues and spinal canal: No prevertebral fluid or swelling. No
visible canal hematoma.

Disc levels:  Multilevel degenerative disc disease.

Upper chest: Negative.

Other: None.
IMPRESSION: 1. Moderate volume acute intraparenchymal hemorrhage in the anterior
aspect of the right frontal lobe exerting local mass effect, however
there is no significant midline shift.
2. Additional small volume acute intraparenchymal hemorrhage in the
anterior left temporal lobe and superomedial aspect of the right
occipital lobe, both with small volume associated subarachnoid
hemorrhage.
3. Motion artifact limits the evaluation for subtle fracture in the
cervical spine, however given this limitation no acute osseous
injury is identified.

These results were called by telephone at the time of interpretation
on [DATE] at [DATE] to provider LABELLE SALHA , who verbally
acknowledged these results.

## 2020-06-20 IMAGING — US US ABDOMEN COMPLETE
1 series · 13 of 25 positions shown · non-contrast
Comparison: None.

CLINICAL DATA: Cirrhosis.

EXAM:
ABDOMEN ULTRASOUND COMPLETE

[Series 1: us abdomen complete · 13 of 106 slices shown]
[im 1/106]
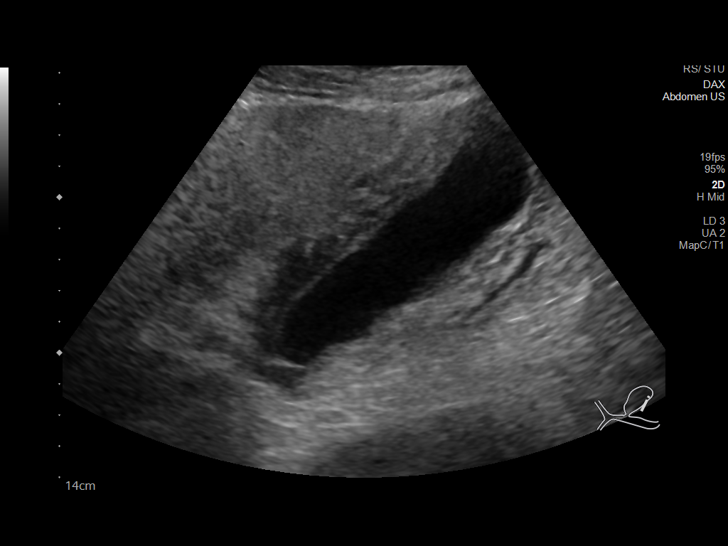
[im 9/106]
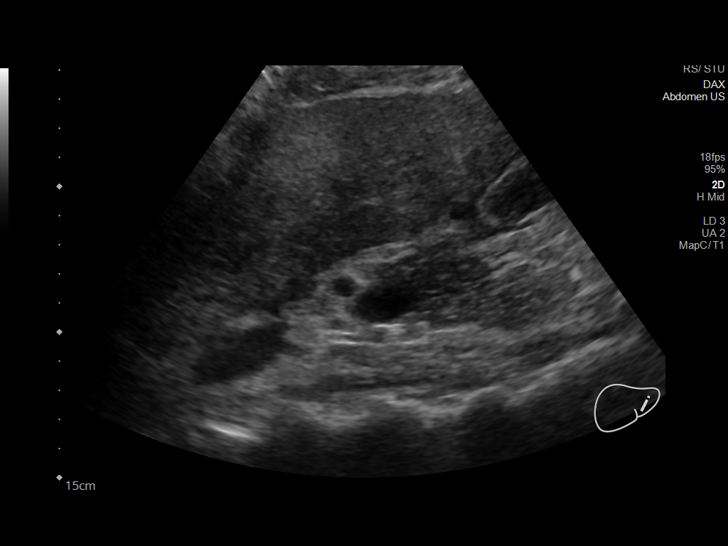
[im 18/106]
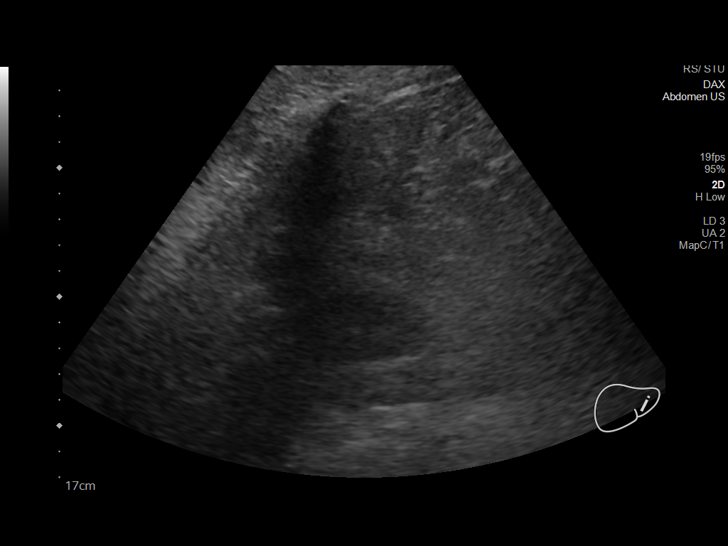
[im 27/106]
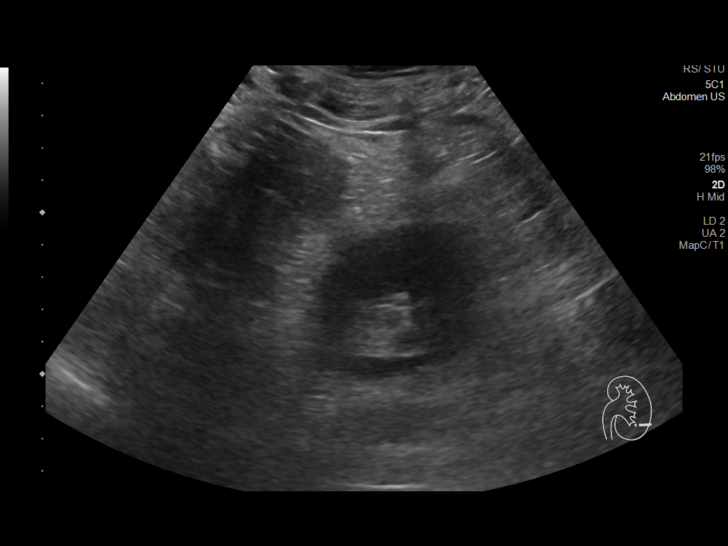
[im 36/106]
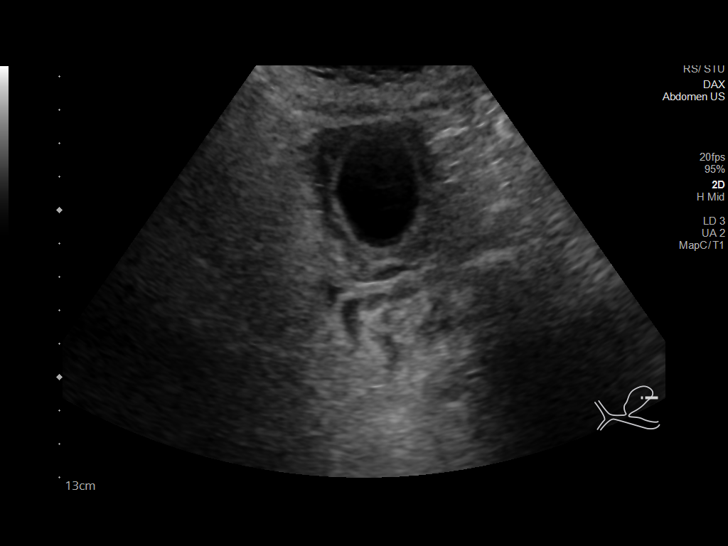
[im 44/106]
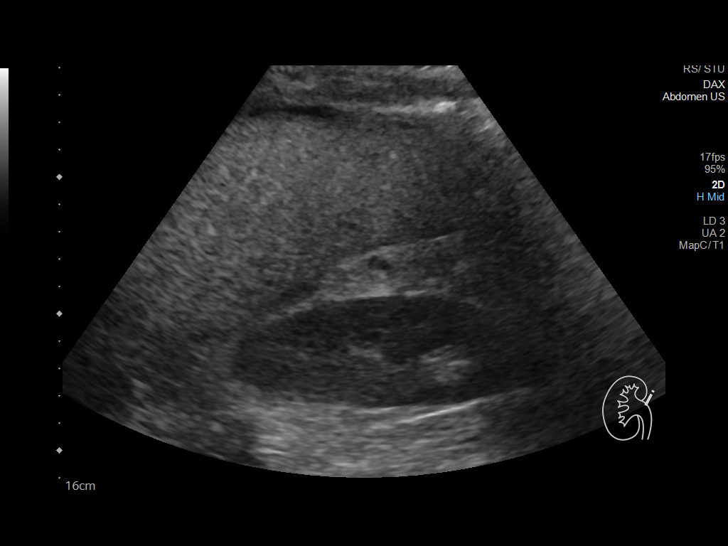
[im 53/106]
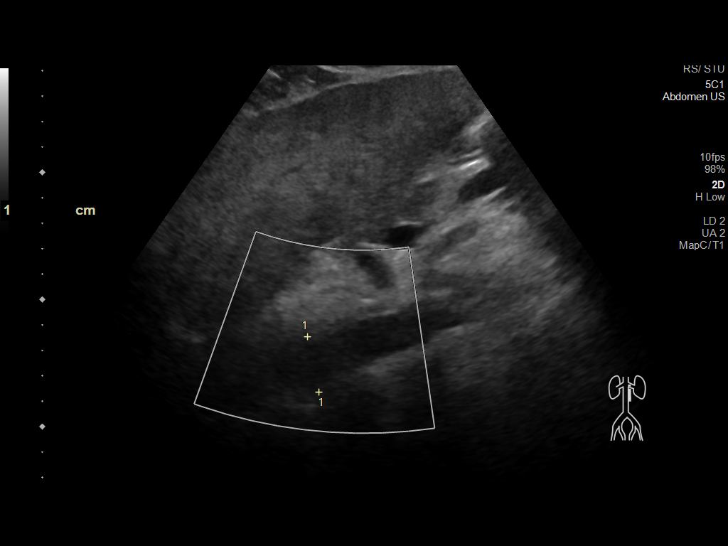
[im 62/106]
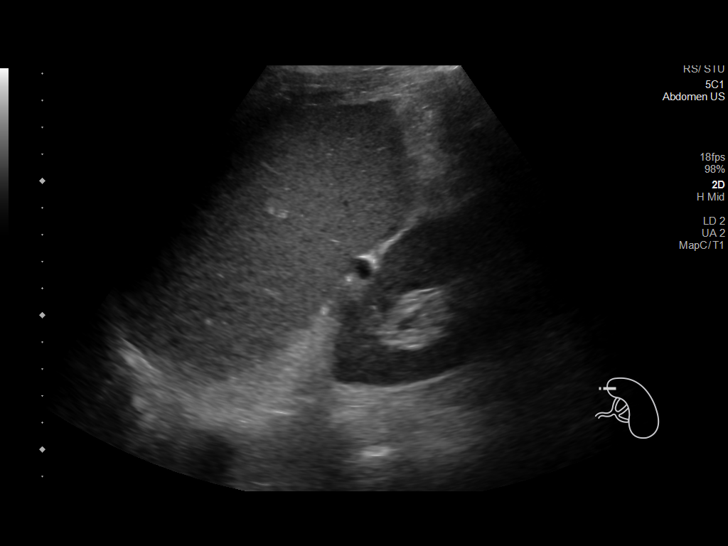
[im 71/106]
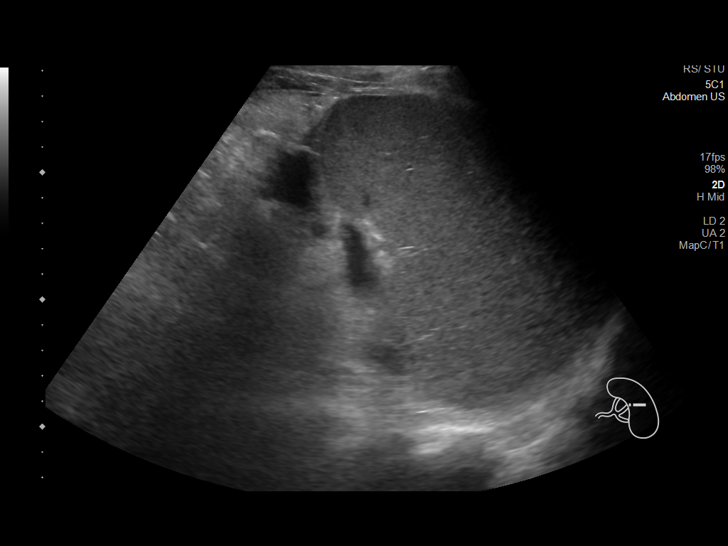
[im 79/106]
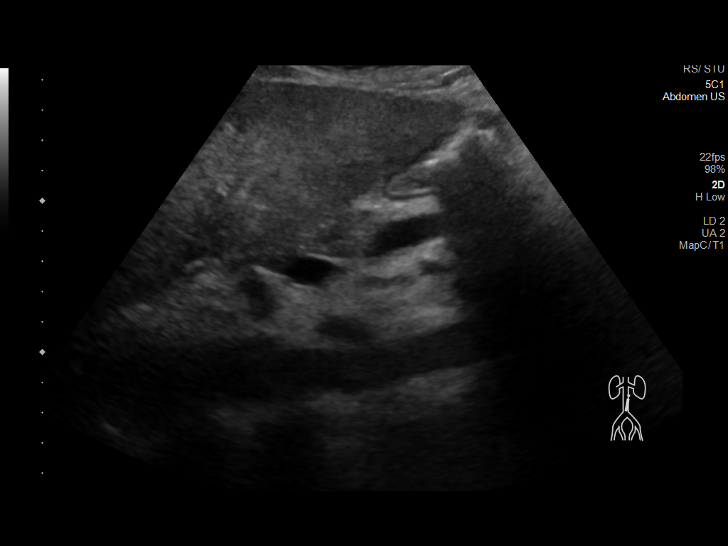
[im 88/106]
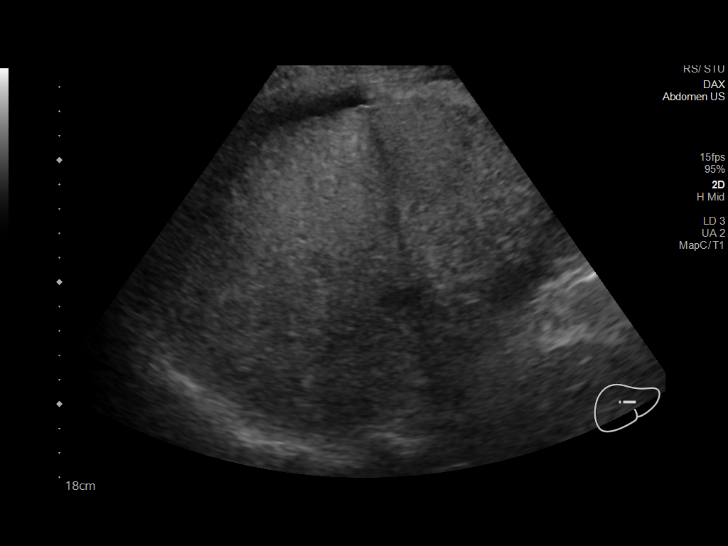
[im 97/106]
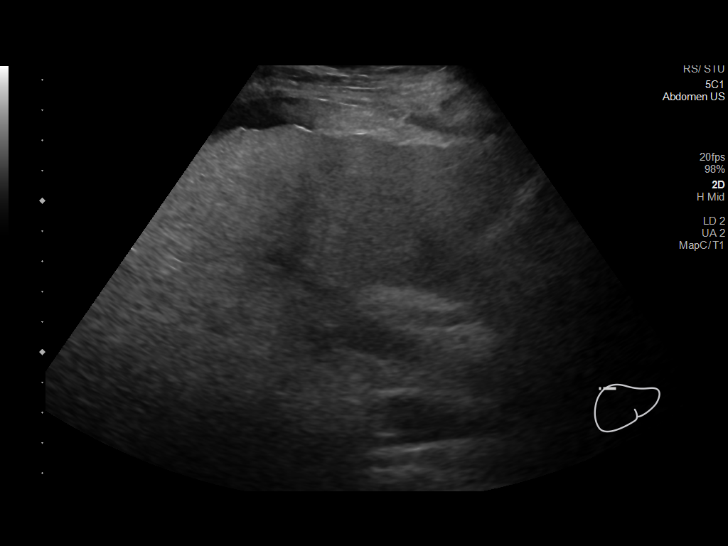
[im 106/106]
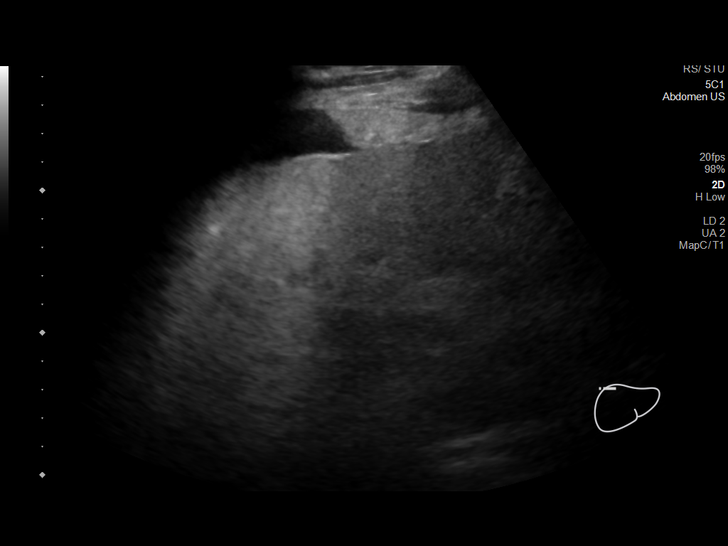

[13 of 25 positions shown; findings below may reference images not displayed]

FINDINGS: Gallbladder: No gallstones are identified. The gallbladder wall is
markedly thickened (1.73 cm). The patient is unresponsive. As a
result, the presence or absence of a sonographic Murphy sign could
not be determined by the sonographer.

Common bile duct: The common bile duct is not clearly visualized.

Liver: No focal lesion identified. Diffusely increased echogenicity
of the liver parenchyma is noted. No flow is seen within the portal
vein on color Doppler imaging.

IVC: Poorly visualized.

Pancreas: Visualized portion unremarkable.

Spleen: The spleen is enlarged (13.4 cm x 13.0 cm x 8.1 cm) with
normal parenchymal echogenicity seen. Multiple varices are seen near
the splenic hilum.

Right Kidney: Length: 12.6 cm. Echogenicity within normal limits. No
mass or hydronephrosis visualized.

Left Kidney: Length: 12.6 cm. Echogenicity within normal limits. No
mass or hydronephrosis visualized.

Abdominal aorta: No aneurysm visualized. It should be noted that the
distal aspect of the abdominal aorta and bifurcation are not clearly
visualized.

Other findings: A mild amount of ascites is noted.
IMPRESSION: 1. Absent portal vein flow which may represent sequelae associated
with portal venous thrombus.
2. Fatty liver.
3. Gallbladder wall thickening without evidence of cholelithiasis.
4. Splenomegaly.

## 2020-06-20 MED ORDER — DEXMEDETOMIDINE HCL IN NACL 400 MCG/100ML IV SOLN
0.4000 ug/kg/h | INTRAVENOUS | Status: DC
  Administered 2020-06-20 – 2020-06-21 (×3): 0.4 ug/kg/h via INTRAVENOUS
  Administered 2020-06-22: 1.2 ug/kg/h via INTRAVENOUS
  Administered 2020-06-22: 0.4 ug/kg/h via INTRAVENOUS
  Administered 2020-06-22 (×2): 0.6 ug/kg/h via INTRAVENOUS
  Administered 2020-06-23: 0.4 ug/kg/h via INTRAVENOUS
  Administered 2020-06-23: 1 ug/kg/h via INTRAVENOUS
  Administered 2020-06-23: 1.2 ug/kg/h via INTRAVENOUS
  Administered 2020-06-23: 0.8 ug/kg/h via INTRAVENOUS
  Administered 2020-06-24: 1 ug/kg/h via INTRAVENOUS
  Filled 2020-06-20: qty 300
  Filled 2020-06-20 (×5): qty 100
  Filled 2020-06-20: qty 200
  Filled 2020-06-20 (×5): qty 100

## 2020-06-20 MED ORDER — LORAZEPAM 2 MG/ML IJ SOLN
1.0000 mg | Freq: Once | INTRAMUSCULAR | Status: AC
  Administered 2020-06-20: 1 mg via INTRAVENOUS
  Filled 2020-06-20: qty 1

## 2020-06-20 MED ORDER — LEVETIRACETAM IN NACL 500 MG/100ML IV SOLN
500.0000 mg | Freq: Two times a day (BID) | INTRAVENOUS | Status: DC
  Administered 2020-06-20 – 2020-06-23 (×7): 500 mg via INTRAVENOUS
  Filled 2020-06-20 (×12): qty 100

## 2020-06-20 MED ORDER — THIAMINE HCL 100 MG/ML IJ SOLN
100.0000 mg | Freq: Every day | INTRAMUSCULAR | Status: DC
  Administered 2020-06-21: 100 mg via INTRAVENOUS
  Filled 2020-06-20: qty 2

## 2020-06-20 MED ORDER — IOHEXOL 300 MG/ML  SOLN
100.0000 mL | Freq: Once | INTRAMUSCULAR | Status: AC | PRN
  Administered 2020-06-20: 100 mL via INTRAVENOUS

## 2020-06-20 MED ORDER — LABETALOL HCL 5 MG/ML IV SOLN: 5.0000 mg | INTRAVENOUS | Status: DC | PRN

## 2020-06-20 MED ORDER — MIDAZOLAM HCL 2 MG/2ML IJ SOLN
1.0000 mg | INTRAMUSCULAR | Status: DC | PRN
  Administered 2020-06-20 – 2020-06-23 (×9): 2 mg via INTRAVENOUS
  Filled 2020-06-20 (×11): qty 2

## 2020-06-20 MED ORDER — CHLORHEXIDINE GLUCONATE CLOTH 2 % EX PADS
6.0000 | MEDICATED_PAD | Freq: Every day | CUTANEOUS | Status: DC
  Administered 2020-06-20 – 2020-06-26 (×6): 6 via TOPICAL

## 2020-06-20 MED ORDER — SODIUM CHLORIDE 0.9 % IV SOLN: 10.0000 mL/h | Freq: Once | INTRAVENOUS | Status: DC

## 2020-06-20 MED ORDER — LACTATED RINGERS IV SOLN: INTRAVENOUS | Status: DC

## 2020-06-20 MED ORDER — SODIUM CHLORIDE 0.9 % IV BOLUS
1000.0000 mL | Freq: Once | INTRAVENOUS | Status: AC
  Administered 2020-06-20: 1000 mL via INTRAVENOUS

## 2020-06-20 MED ORDER — VITAMIN K1 10 MG/ML IJ SOLN
5.0000 mg | Freq: Once | INTRAVENOUS | Status: AC
  Administered 2020-06-20: 5 mg via INTRAVENOUS
  Filled 2020-06-20: qty 0.5

## 2020-06-20 MED ORDER — THIAMINE HCL 100 MG/ML IJ SOLN
Freq: Once | INTRAVENOUS | Status: AC
  Filled 2020-06-20: qty 1000

## 2020-06-20 MED ORDER — THIAMINE HCL 100 MG/ML IJ SOLN
100.0000 mg | Freq: Once | INTRAMUSCULAR | Status: AC
  Administered 2020-06-20: 100 mg via INTRAVENOUS
  Filled 2020-06-20: qty 2

## 2020-06-20 MED ORDER — PHYTONADIONE 5 MG PO TABS
5.0000 mg | ORAL_TABLET | Freq: Once | ORAL | Status: DC
  Filled 2020-06-20: qty 1

## 2020-06-20 MED ORDER — SODIUM CHLORIDE 0.9% IV SOLUTION: Freq: Once | INTRAVENOUS | Status: DC

## 2020-06-20 MED ORDER — LORAZEPAM 2 MG/ML IJ SOLN
4.0000 mg | Freq: Once | INTRAMUSCULAR | Status: AC
  Administered 2020-06-21: 4 mg via INTRAVENOUS
  Filled 2020-06-20: qty 2

## 2020-06-20 MED ORDER — LEVETIRACETAM 500 MG PO TABS
500.0000 mg | ORAL_TABLET | Freq: Two times a day (BID) | ORAL | Status: DC
  Administered 2020-06-24 – 2020-06-30 (×13): 500 mg via ORAL
  Filled 2020-06-20 (×14): qty 1

## 2020-06-20 MED ORDER — LEVETIRACETAM IN NACL 1500 MG/100ML IV SOLN
1500.0000 mg | Freq: Once | INTRAVENOUS | Status: AC
  Administered 2020-06-20: 1500 mg via INTRAVENOUS
  Filled 2020-06-20: qty 100

## 2020-06-20 NOTE — ED Provider Notes (Signed)
[facility]ERGENCY DEPARTMENT Provider Note   CSN: [id number] Arrival date & time: 06/20/20  1327     History No chief complaint on file.   Matthew Horn is a 39 y.o. male.  HPI Patient seen by me at 1:30 PM.  EMS in the room with patient and I discussed findings with them.  They state a little over an hour ago they were called to the scene where he was having a seizure.  They found him to be postictal.  At some point he awoke and was able to give history and he denied anything.  The patient then proceeded to have "3 grand mal seizures," during transport.  The paramedic with the patient describes 3 separate episodes of bilateral tonic-clonic and head movement, that lasted 1 minute.  She gave him serial doses of Versed totaling 5 mg.  On arrival patient has sonorous respirations and is not seizing visibly.  He is unable to give any history.  The paramedic states that the patient is visiting from [place], to work in [place].  He was apparently leaving a restaurant when he had a seizure.  Level 5 caveat-altered mental status    No past medical history on file.  Patient Active Problem List   Diagnosis Date Noted  . Intracerebral hemorrhage ([place]) 06/20/2020       No family history on file.  Social History   Tobacco Use  . Smoking status: Not on file  Substance Use Topics  . Alcohol use: Not on file  . Drug use: Not on file    Home Medications Prior to Admission medications   Not on File    Allergies    Patient has no allergy information on record.  Review of Systems   Review of Systems  Unable to perform ROS: Mental status change    Physical Exam Updated Vital Signs BP 111/68   Pulse (!) 122   Temp 98.7 F (37.1 C) (Oral)   Resp 18   Wt 108.4 kg Comment: Bed scale  SpO2 100%   Physical Exam Vitals and nursing note reviewed.  Constitutional:      Appearance: He is well-developed.  HENT:     Head: Normocephalic and  atraumatic.     Comments: Examination of head and scalp deferred until imaging can be done.    Right Ear: External ear normal.     Left Ear: External ear normal.  Eyes:     General: Scleral icterus present.     Conjunctiva/sclera: Conjunctivae normal.     Pupils: Pupils are equal, round, and reactive to light.     Comments: Roving eye movements on initial examination crossing the midline.  Neck:     Trachea: Phonation normal.  Cardiovascular:     Rate and Rhythm: Normal rate and regular rhythm.     Heart sounds: Normal heart sounds.  Pulmonary:     Effort: Pulmonary effort is normal.     Breath sounds: Normal breath sounds.  Abdominal:     Palpations: Abdomen is soft.     Tenderness: There is no abdominal tenderness.  Musculoskeletal:        General: Normal range of motion.     Cervical back: Normal range of motion and neck supple.  Skin:    General: Skin is warm and dry.     Coloration: Skin is jaundiced.  Neurological:     Mental Status: He is alert.     Motor: No abnormal muscle tone.  Comments: Controlling airway, heavily sedated, after 5 mg of Versed, no focal asymmetry.  Tone in bilateral arms and legs seems normal for situation.  Psychiatric:     Comments: Lethargic     ED Results / Procedures / Treatments   Labs (all labs ordered are listed, but only abnormal results are displayed) Labs Reviewed  COMPREHENSIVE METABOLIC PANEL - Abnormal; Notable for the following components:      Result Value   CO2 9 (*)    Glucose, Bld 105 (*)    Calcium 8.0 (*)    Albumin 3.1 (*)    AST 156 (*)    Alkaline Phosphatase 179 (*)    Total Bilirubin 6.2 (*)    Anion gap 25 (*)    All other components within normal limits  CBC WITH DIFFERENTIAL/PLATELET - Abnormal; Notable for the following components:   WBC 11.8 (*)    RBC 3.60 (*)    Hemoglobin 8.6 (*)    HCT 30.5 (*)    MCH 23.9 (*)    MCHC 28.2 (*)    RDW 20.2 (*)    Platelets 41 (*)    nRBC 0.3 (*)    Lymphs Abs  4.5 (*)    Monocytes Absolute 1.5 (*)    Abs Immature Granulocytes 0.21 (*)    All other components within normal limits  ETHANOL - Abnormal; Notable for the following components:   Alcohol, Ethyl (B) 15 (*)    All other components within normal limits  URINALYSIS, ROUTINE W REFLEX MICROSCOPIC - Abnormal; Notable for the following components:   Color, Urine AMBER (*)    APPearance HAZY (*)    Hgb urine dipstick MODERATE (*)    Protein, ur 100 (*)    Bacteria, UA RARE (*)    All other components within normal limits  RAPID URINE DRUG SCREEN, HOSP PERFORMED - Abnormal; Notable for the following components:   Benzodiazepines POSITIVE (*)    All other components within normal limits  PROTIME-INR - Abnormal; Notable for the following components:   Prothrombin Time 19.0 (*)    INR 1.7 (*)    All other components within normal limits  SARS CORONAVIRUS 2 BY RT PCR (HOSPITAL ORDER, [facility])  HIV ANTIBODY (ROUTINE TESTING W REFLEX)  HEPATIC FUNCTION PANEL  HCV AB W REFLEX TO QUANT PCR  HEPATITIS PANEL, ACUTE  HEPATITIS B SURFACE ANTIGEN  HEPATITIS B SURFACE ANTIBODY, QUANTITATIVE  COMPREHENSIVE METABOLIC PANEL  MAGNESIUM  PHOSPHORUS  CBC WITH DIFFERENTIAL/PLATELET  PROTIME-INR  CBG MONITORING, ED  TYPE AND SCREEN  PREPARE FRESH FROZEN PLASMA  ABO/RH  PREPARE PLATELET PHERESIS  PREPARE PLATELET PHERESIS    EKG None  Radiology CT Head Wo Contrast  Result Date: 06/20/2020 CLINICAL DATA:  Seizure and head/cervical spine injury. EXAM: CT HEAD WITHOUT CONTRAST CT CERVICAL SPINE WITHOUT CONTRAST TECHNIQUE: Multidetector CT imaging of the head and cervical spine was performed following the standard protocol without intravenous contrast. Multiplanar CT image reconstructions of the cervical spine were also generated. COMPARISON:  None. FINDINGS: CT HEAD FINDINGS Brain: Moderate volume acute intraparenchymal hemorrhage in the anterior aspect of the right  frontal lobe measures 2.8 x 2.8 (series 6, image 19) x 3.7 cm (series 4, image 17). This exerts local mass effect, however there is no significant midline shift. Additional small volume acute intraparenchymal hemorrhage is seen in the anterior left temporal lobe and superomedial aspect of the right occipital lobe, both with small volume associated subarachnoid hemorrhage. There  is at least one focus of subarachnoid hemorrhage overlying the left occipital/parietal lobe (series 5, image 37). The ventricles are normal size, shape and morphology. Basilar cisterns are patent. Vascular: No hyperdense vessel or unexpected calcification. Skull: Normal. Negative for fracture or focal lesion. Sinuses/Orbits: No acute finding. Other: There is a large scalp hematoma overlying the left parietal bone. CT CERVICAL SPINE FINDINGS Alignment: Normal. Skull base and vertebrae: Motion artifact limits the evaluation for subtle fracture, however given this limitation no acute osseous injury is identified. No primary bone lesion or focal pathologic process. Soft tissues and spinal canal: No prevertebral fluid or swelling. No visible canal hematoma. Disc levels:  Multilevel degenerative disc disease. Upper chest: Negative. Other: None. IMPRESSION: 1. Moderate volume acute intraparenchymal hemorrhage in the anterior aspect of the right frontal lobe exerting local mass effect, however there is no significant midline shift. 2. Additional small volume acute intraparenchymal hemorrhage in the anterior left temporal lobe and superomedial aspect of the right occipital lobe, both with small volume associated subarachnoid hemorrhage. 3. Motion artifact limits the evaluation for subtle fracture in the cervical spine, however given this limitation no acute osseous injury is identified. These results were called by telephone at the time of interpretation on 06/20/2020 at 3:09 pm to provider [facility] , who verbally acknowledged these results.  Electronically Signed   By: Zerita Boers M.D.   On: 06/20/2020 15:10   CT Cervical Spine Wo Contrast  Result Date: 06/20/2020 CLINICAL DATA:  Seizure and head/cervical spine injury. EXAM: CT HEAD WITHOUT CONTRAST CT CERVICAL SPINE WITHOUT CONTRAST TECHNIQUE: Multidetector CT imaging of the head and cervical spine was performed following the standard protocol without intravenous contrast. Multiplanar CT image reconstructions of the cervical spine were also generated. COMPARISON:  None. FINDINGS: CT HEAD FINDINGS Brain: Moderate volume acute intraparenchymal hemorrhage in the anterior aspect of the right frontal lobe measures 2.8 x 2.8 (series 6, image 19) x 3.7 cm (series 4, image 17). This exerts local mass effect, however there is no significant midline shift. Additional small volume acute intraparenchymal hemorrhage is seen in the anterior left temporal lobe and superomedial aspect of the right occipital lobe, both with small volume associated subarachnoid hemorrhage. There is at least one focus of subarachnoid hemorrhage overlying the left occipital/parietal lobe (series 5, image 37). The ventricles are normal size, shape and morphology. Basilar cisterns are patent. Vascular: No hyperdense vessel or unexpected calcification. Skull: Normal. Negative for fracture or focal lesion. Sinuses/Orbits: No acute finding. Other: There is a large scalp hematoma overlying the left parietal bone. CT CERVICAL SPINE FINDINGS Alignment: Normal. Skull base and vertebrae: Motion artifact limits the evaluation for subtle fracture, however given this limitation no acute osseous injury is identified. No primary bone lesion or focal pathologic process. Soft tissues and spinal canal: No prevertebral fluid or swelling. No visible canal hematoma. Disc levels:  Multilevel degenerative disc disease. Upper chest: Negative. Other: None. IMPRESSION: 1. Moderate volume acute intraparenchymal hemorrhage in the anterior aspect of the right  frontal lobe exerting local mass effect, however there is no significant midline shift. 2. Additional small volume acute intraparenchymal hemorrhage in the anterior left temporal lobe and superomedial aspect of the right occipital lobe, both with small volume associated subarachnoid hemorrhage. 3. Motion artifact limits the evaluation for subtle fracture in the cervical spine, however given this limitation no acute osseous injury is identified. These results were called by telephone at the time of interpretation on 06/20/2020 at 3:09 pm to provider [facility] ,  who verbally acknowledged these results. Electronically Signed   By: Zerita Boers M.D.   On: 06/20/2020 15:10      Date: 06/20/2020  Rate: 151  Rhythm: sinus tachycardia  QRS Axis: normal  PR and QT Intervals: normal  ST/T Wave abnormalities: nonspecific ST changes  PR and QRS Conduction Disutrbances:none    Procedures .Marland KitchenLaceration Repair  Date/Time: 06/20/2020 3:25 PM Performed by: Daleen Bo, MD Authorized by: Daleen Bo, MD   Consent:    Consent obtained:  Emergent situation Laceration details:    Location:  Scalp   Scalp location:  Occipital   Length (cm):  3   Depth (mm):  10 Repair type:    Repair type:  Simple Pre-procedure details:    Preparation:  Imaging obtained to evaluate for foreign bodies Exploration:    Hemostasis achieved with:  Direct pressure   Wound exploration: wound explored through full range of motion     Wound extent: no areolar tissue violation noted, no fascia violation noted, no foreign bodies/material noted, no muscle damage noted, no underlying fracture noted and no vascular damage noted     Contaminated: no   Treatment:    Area cleansed with:  Saline   Amount of cleaning:  Standard   Visualized foreign bodies/material removed: no   Skin repair:    Repair method:  Staples Approximation:    Approximation:  Close Post-procedure details:    Dressing:  Non-adherent dressing    Patient tolerance of procedure:  Tolerated well, no immediate complications Comments:     Moderate bleeding controlled  .Critical Care Performed by: Daleen Bo, MD Authorized by: Daleen Bo, MD   Critical care provider statement:    Critical care time (minutes):  120   Critical care start time:  06/20/2020 1:30 PM   Critical care end time:  06/20/2020 3:57 PM   Critical care time was exclusive of:  Separately billable procedures and treating other patients   Critical care was necessary to treat or prevent imminent or life-threatening deterioration of the following conditions:  Trauma   Critical care was time spent personally by me on the following activities:  Blood draw for specimens, development of treatment plan with patient or surrogate, discussions with consultants, evaluation of patient's response to treatment, examination of patient, obtaining history from patient or surrogate, ordering and performing treatments and interventions, ordering and review of laboratory studies, pulse oximetry, re-evaluation of patient's condition, review of old charts and ordering and review of radiographic studies   (including critical care time)  Medications Ordered in ED Medications  0.9 %  sodium chloride infusion (Manually program via Guardrails IV Fluids) (has no administration in time range)  phytonadione (VITAMIN K) 5 mg in dextrose 5 % 50 mL IVPB (5 mg Intravenous New Bag/Given 06/20/20 1633)  0.9 %  sodium chloride infusion (has no administration in time range)  levETIRAcetam (KEPPRA) tablet 500 mg (has no administration in time range)    Or  levETIRAcetam (KEPPRA) IVPB 500 mg/100 mL premix (has no administration in time range)  lactated ringers infusion (has no administration in time range)  thiamine (B-1) injection 100 mg (has no administration in time range)  sodium chloride 0.9 % 1,000 mL with thiamine 121 mg, folic acid 1 mg, multivitamins adult 10 mL infusion (has no administration  in time range)  midazolam (VERSED) injection 1-2 mg (has no administration in time range)  labetalol (NORMODYNE) injection 5 mg (has no administration in time range)  levETIRAcetam (KEPPRA) IVPB 1500 mg/  100 mL premix (0 mg Intravenous Stopped 06/20/20 1448)  LORazepam (ATIVAN) injection 1 mg (1 mg Intravenous Given 06/20/20 1407)  sodium chloride 0.9 % bolus 1,000 mL (1,000 mLs Intravenous New Bag/Given 06/20/20 1407)  thiamine (B-1) injection 100 mg (100 mg Intravenous Given 06/20/20 1512)    ED Course  I have reviewed the triage vital signs and the nursing notes.  Pertinent labs & imaging results that were available during my care of the patient were reviewed by me and considered in my medical decision making (see chart for details).  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 The patient appeared to be somewhat shaky. They both went to lunch, and after eating the patient had the episode described in the HPI above. His employer does not have any medical history or other knowledge of the patient's recent history. I will try to contact family members, through his information that he has. He will be available for nursing to contact as needed.   [EW]  1530 The case has been presented to Dr. Reatha Armour, neurosurgeon on-call.  He has reviewed the patient's images.  He recommends that the patient be admitted to the intensive care unit, and he will remain on the case as a Optometrist.   [EW]  1530 Case is also been discussed with the neuro hospitalist, who will see the patient as a Optometrist.  They have recommended the patient receive platelet transfusions until his platelets are greater than 100, along with FFP, and vitamin K.   [EW]  2947 Abnormally prolonged  Protime-INR(!) [EW]  1533  Normal except elevation white count, hemoglobin low, platelets low  CBC with Differential(!) [EW]  1533 Abnormal, presence of blood, protein, bacteria  Urinalysis, Routine w reflex microscopic Urine, Catheterized(!) [EW]  1533 Alcohol present, abnormal  Ethanol(!) [EW]  1534 Normal except CO2 low, glucose high, calcium low, albumin low, AST high, alk phos stays high, total bilirubin 9, anion gap high  Comprehensive metabolic panel(!) [EW]  6546 Monocyte #(!): 1.5 [EW]    Clinical Course User Index [EW] Daleen Bo, MD   MDM Rules/Calculators/A&P                           Patient Vitals for the past 24 hrs:  BP Temp Temp src Pulse Resp SpO2 Weight  06/20/20 1634 111/68 98.7 F (37.1 C) Oral (!) 122 18 100 % --  06/20/20 1615 106/66 98.8 F (37.1 C) Oral (!) 124 20 -- --  06/20/20 1530 -- -- -- (!) 138 14 -- --  06/20/20 1445 133/80 -- -- (!) 150 (!) 24 100 % --  06/20/20 1415 113/71 -- -- (!) 142 19 100 % --  06/20/20 1400 -- -- -- -- -- -- 108.4 kg  06/20/20 1338 139/78 99.5 F (37.5 C) Rectal (!) 149 20 -- --    3:31 PM Reevaluation with update and discussion. After initial assessment and treatment, an updated evaluation reveals after staple closure of the occipital scalp wound, the patient was more alert, and somewhat conversant, admitting to use of alcohol, heavily.  Patient remains restrained for prevention of dislodgment of treatment and evaluating devices. Daleen Bo   Medical Decision Making:  This patient is presenting for evaluation  of seizure with head injuries, which does require a range of treatment options, and is a complaint that involves a high risk of morbidity and mortality. The differential diagnoses include traumatic injury, preceding injury causing seizure with secondary injury complications of alcoholism. I decided to review old records, and in summary , presenting with seizure, and injuries to the head, possibly multiple.  Presented to EMS system as  seizure, requiring treatment with benzodiazepine to control symptoms.  He apparently had multiple seizures..  I obtained additional historical information from a coworker.  Clinical Laboratory Tests Ordered, included CBC, Metabolic panel, Urinalysis and INR, alcohol level, Covid test. Review indicates multiple abnormalities including; abnormalities include hemoglobin low, platelets low, CO2 low, AST high, total bilirubin high, INR prolonged 1.7 Radiologic Tests Ordered, included CT head and cervical spine.  I independently Visualized: Radiographic images, which show 3 separate sites of intracranial bleeding, with both intraparenchymal and subarachnoid blood.  No visible fracture of the cervical spine however images were limited.  Because of this latter problem, his cervical collar will be maintained in the emergency department pending improved mental status  Cardiac Monitor Tracing which shows normal sinus rhythm    Critical Interventions-clinical evaluation, empiric treatment for seizures with IV Keppra and Ativan given to help manage agitation.  Labs and imaging ordered.  Imaging returned showing intracranial bleeding, with concern for relation to prolonged INR, likely from alcohol intake.  Seizure most likely alcohol withdrawal related possibly related to a fall last week however cannot correlate this clinically at this time.  Patient with elevated INR, and low platelets and low hemoglobin.  Patient will be supported with platelet and FFP transfusions to decrease chance of bleeding.  INR elevation treated with vitamin K.  Scalp wound stapled, to control bleeding.  Patient required restraint, to prevent dislodgment of evaluating and treating devices.  He did not require intubation in the emergency department.  Cervical spine CT does not reveal fracture however cannot clinically rule out fracture and the images were somewhat suboptimal, so cervical spine immobilization will be maintained.  Consultations  with neuro hospitalist, neurosurgery, and intensive care service.  Anticipate admission by critical care.  After These Interventions, the Patient was reevaluated and was found to be critically ill with intracranial bleeding, prolonged INR, seizure, anemia, thrombocytopenia, and head injury.  Consultations with neurosurgery, neuro hospitalist, and pulmonary critical care for admission management.  CRITICAL CARE-yes Performed by: Daleen Bo  Nursing Notes Reviewed/ Care Coordinated Applicable Imaging Reviewed Interpretation of Laboratory Data incorporated into ED treatment   Plan: Admit to intensive care unit for management direction in the intensive care unit.  Consultations neurosurgery and neuro hospitalist for assistance with management.    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

## 2020-06-20 NOTE — ED Notes (Signed)
[place] P[place] friend [phone] would like an update on the pt

## 2020-06-20 NOTE — Progress Notes (Signed)
Tech was at room pt was having another procedure (echo) will try back when schedule permits.

## 2020-06-20 NOTE — ED Notes (Signed)
Pt transported to CT ?

## 2020-06-20 NOTE — H&P (Addendum)
NAME:  Matthew Horn, MRN:  [id number], DOB:  07-01-1981, LOS: 0 ADMISSION DATE:  06/20/2020, CONSULTATION DATE: 830 10/2019 REFERRING MD: [facility] ED, CHIEF COMPLAINT: ICH  HPI/course in hospital  39 year old man with significant alcohol history.  Witnessed generalized tonic-clonic seizure with loss of consciousness while exiting restaurant today.  Brought here by EMS.  3 seizures en route which terminated with Versed 5 mg  Found to have a large superficial skull laceration on the right occiput which was stapled.  CT scan shows 2 small areas of intraparenchymal hemorrhage.  Patient remains confused and is not able to provide reliable corroborating history.  He apparently is from out of town.  Past Medical History  No past medical history on file.  Patient is not able to provide reliable history.  Review of Systems:   Review of Systems  Unable to perform ROS: Mental status change    Social History    Unknown Family History   His family history is not on file.   Allergies Not on File   Home Medications  Prior to Admission medications   Not on File     Interim history/subjective:  Head laceration sutured.  Has been calm and more awake in ED but requiring four-point restraints.  Being transfused platelets for thrombocytopenia.  Objective   Blood pressure 133/80, pulse (!) 138, temperature 99.5 F (37.5 C), temperature source Rectal, resp. rate 14, weight 108.4 kg, SpO2 100 %.       No intake or output data in the 24 hours ending 06/20/20 1611 Filed Weights   06/20/20 1400  Weight: 108.4 kg    Examination: Physical Exam Constitutional:      Appearance: He is well-developed.     Interventions: Cervical collar in place.     Comments: Poorly groomed.   HENT:     Head: Laceration present.     Comments: Right supra laceration Eyes:     General: Scleral icterus present.     Extraocular Movements: Extraocular movements intact.  Neck:     Comments: Cervical collar  in place Cardiovascular:     Heart sounds: Normal heart sounds.  Pulmonary:     Breath sounds: Normal breath sounds and air entry.  Abdominal:     Palpations: Abdomen is soft.     Tenderness: There is no abdominal tenderness.     Comments: No stigmata of chronic liver disease  Genitourinary:    Penis: Normal.   Skin:    General: Skin is warm.     Coloration: Skin is jaundiced.     Findings: Ecchymosis present.     Comments: Skin is jaundiced  Neurological:     Mental Status: He is alert. He is confused.     GCS: GCS eye subscore is 4. GCS verbal subscore is 5. GCS motor subscore is 6.     Cranial Nerves: Cranial nerves are intact.     Motor: Motor function is intact.     Comments: Moves all limbs but follows commands only intermittently.      Ancillary tests (personally reviewed)  CBC: Recent Labs  Lab 06/20/20 1343  WBC 11.8*  NEUTROABS 5.2  HGB 8.6*  HCT 30.5*  MCV 84.7  PLT 41*    Basic Metabolic Panel: Recent Labs  Lab 06/20/20 1343  NA 135  K 3.6  CL 101  CO2 9*  GLUCOSE 105*  BUN 6  CREATININE 1.14  CALCIUM 8.0*   GFR: CrCl cannot be calculated (Unknown ideal weight.). Recent Labs  Lab 06/20/20 1343  WBC 11.8*    Liver Function Tests: Recent Labs  Lab 06/20/20 1343  AST 156*  ALT 27  ALKPHOS 179*  BILITOT 6.2*  PROT 7.2  ALBUMIN 3.1*   No results for input(s): LIPASE, AMYLASE in the last 168 hours. No results for input(s): AMMONIA in the last 168 hours.  ABG No results found for: PHART, PCO2ART, PO2ART, HCO3, TCO2, ACIDBASEDEF, O2SAT   Coagulation Profile: Recent Labs  Lab 06/20/20 1432  INR 1.7*    Cardiac Enzymes: No results for input(s): CKTOTAL, CKMB, CKMBINDEX, TROPONINI in the last 168 hours.  HbA1C: No results found for: HGBA1C  CBG: Recent Labs  Lab 06/20/20 1334  GLUCAP 97     Assessment & Plan:   Critically ill due to traumatic intraparenchymal hemorrhage following fall related to witnessed  seizure. Mild confusion with no focal deficits. Mechanism and pattern of injury suggest patient at risk for further decompensation as cerebral contusions involved. -Admit to ICU for frequent neurological monitoring -Keep blood pressure systolic less than 160.  Recent seizure on background of possible known seizure disorder/alcohol use.  Unclear what his recent intake has been but remains at risk for alcohol withdrawal. -Seizure precautions -Keppra for seizure prevention  History of alcoholism at risk for withdrawal. -CIWA protocol. -We will avoid preemptive medication as not cloud mental examination.  Jaundiced with thrombocytopenia.  Suggestive of alcoholic cirrhosis.  -Abdominal ultrasound looking for liver contour -LFTs, rule out alcoholic hepatitis -Viral panel to rule out concurrent viral hepatitis.  Mild alcoholic hepatitis but discriminant function not sufficiently elevated to justify steroids (23.7, <32)   Daily Goals Checklist  Pain/Anxiety/Delirium protocol (if indicated): CIWA only Neuro vitals: every 1 hours AED's: Keppra VAP protocol (if indicated): Not intubated Respiratory support goals: Titrate oxygen to sat greater than 92% Blood pressure target: Keep systolic blood pressure less than 160 DVT prophylaxis: SCDs only Nutrition Status: N.p.o. GI prophylaxis: Not indicated Fluid status goals: Allow autoregulation Urinary catheter: External catheter only Central lines: PIV Glucose control: Monitor Mobility/therapy needs: Bedrest, needs restraints to prevent self-harm Antibiotic de-escalation: No antibiotics Home medication reconciliation: None known Daily labs: CBC, CMP daily Code Status: Full code Family Communication: We will attempt to contact friend Disposition: ICU  CRITICAL CARE Performed by: Lynnell Catalan   Total critical care time: 40 minutes  Critical care time was exclusive of separately billable procedures and treating other  patients.  Critical care was necessary to treat or prevent imminent or life-threatening deterioration.  Critical care was time spent personally by me on the following activities: development of treatment plan with patient and/or surrogate as well as nursing, discussions with consultants, evaluation of patient's response to treatment, examination of patient, obtaining history from patient or surrogate, ordering and performing treatments and interventions, ordering and review of laboratory studies, ordering and review of radiographic studies, pulse oximetry, re-evaluation of patient's condition and participation in multidisciplinary rounds.  Lynnell Catalan, MD [facility] ICU Physician [facility]  Pager: [phone] Mobile: [phone] After hours: [phone].    06/20/2020, 4:11 PM

## 2020-06-20 NOTE — ED Notes (Addendum)
Pt altered, unable to sign blood consent. No family identified in chart- Dr Effie Shy aware- ok to transfuse, give blood products emergently. Second RN verification ;Production assistant, radio

## 2020-06-20 NOTE — ED Notes (Signed)
US at bedside

## 2020-06-20 NOTE — Consult Note (Signed)
NEUROLOGY CONSULTATION NOTE   Date of service: June 20, 2020 Patient Name: Matthew Horn MRN:  [id number] DOB:  24-Dec-1980 Reason for consult: "seizure, fall with ICH"  History of Present Illness  Matthew Horn is a 39 y.o. male with PMH significant for EtOh use (6 pack of beer about 2-3 times a day) who presents with seizure and head trauma. Matthew Horn reports that he was crawling into the chair when he hit his forehead on the frame. Does not remember what happened next. It seem like per hx that he was leaving restaurant with his boss when he had seizure in the parking lot. He was brought in by the EMS. Was noted to have 3 seizure enroute and given Versed 5mg . endorses that he was in [place] last week. He was trying to do a handstand and walk on his hands and he hit his head on the ice chest.  In the ED, he had workup with [facility] which demonstrated acute R frontal IPH and small volume anterior left temporal and superomedial R occipital lobe ICH with small SAH. He was loaded with Keppra. Labs with liow platelet to 41, INR of 1.7. He is not on any anticoagulation but does appear jaundiced with elevated AST and T bili to 6.2.  Matthew Horn had bleeding from his scalp and and also noted to have bruised tongue with some dried blood on his lips. He appears to have dysarthric speech probably due to the tongue bruising. He denies any past history of seizures.   ROS   Significant dysarthric speech, difficult to obtain a detailed ROS, PMH, PSH. Denies any focal arm or leg weakness.  Past History  No past medical history on file. The histories are not reviewed yet. Please review them in the "History" navigator section and refresh this SmartLink. No family history on file. Social History   Socioeconomic History  . Marital status: Unknown    Spouse name: Not on file  . Number of children: Not on file  . Years of education: Not on file  . Highest education level: Not on file  Occupational  History  . Not on file  Tobacco Use  . Smoking status: Not on file  Substance and Sexual Activity  . Alcohol use: Not on file  . Drug use: Not on file  . Sexual activity: Not on file  Other Topics Concern  . Not on file  Social History Narrative  . Not on file   Social Determinants of Health   Financial Resource Strain:   . Difficulty of Paying Living Expenses: Not on file  Food Insecurity:   . Worried About [facility] in the Last Year: Not on file  . Ran Out of Food in the Last Year: Not on file  Transportation Needs:   . Lack of Transportation (Medical): Not on file  . Lack of Transportation (Non-Medical): Not on file  Physical Activity:   . Days of Exercise per Week: Not on file  . Minutes of Exercise per Session: Not on file  Stress:   . Feeling of Stress : Not on file  Social Connections:   . Frequency of Communication with Friends and Family: Not on file  . Frequency of Social Gatherings with Friends and Family: Not on file  . Attends Religious Services: Not on file  . Active Member of Clubs or Organizations: Not on file  . Attends Programme researcher, broadcasting/film/video Meetings: Not on file  . Marital Status: Not on file  Not on File  Medications  (Not in a hospital admission)    Vitals  Temp:  [99.5 F (37.5 C)] 99.5 F (37.5 C) (08/31 1338) Pulse Rate:  [138-149] 138 (08/31 1530) Resp:  [14-20] 14 (08/31 1530) BP: (113-139)/(71-78) 113/71 (08/31 1415) SpO2:  [100 %] 100 % (08/31 1415) Weight:  [108.4 kg] 108.4 kg (08/31 1400)  There is no height or weight on file to calculate BMI.  Physical Exam   General: Laying comfortably in bed; in no acute distress.  HENT: Dried blood at the lips. Normal oropharynx with tongue bites. Normal external appearance of ears and nose. Neck: Supple, no pain or tenderness CV: No JVD. No peripheral edema. Pulmonary: Symmetric Chest rise. Normal respiratory effort. Abdomen: Soft to touch, non-tender Ext: No cyanosis, edema, or  deformity  Skin: Jaundiced skin. Normal palpation of skin.   Musculoskeletal: Normal digits and nails by inspection. No clubbing.  Neurologic Examination  Mental status/Cognition: Alert, oriented to self, place, but not to month and year. Poor attention. Speech/language: severely dysarthric, fluent, comprehension intact to simple commands.  Cranial nerves:   CN II Pupils equal and reactive to light, no VF deficits   CN III,IV,VI EOM intact, no gaze preference or deviation, no nystagmus   CN V normal sensation in V1, V2, and V3 segments bilaterally   CN VII no asymmetry, no nasolabial fold flattening   CN VIII normal hearing to speech   CN IX & X normal palatal elevation, no uvular deviation   CN XI 5/5 head turn and 5/5 shoulder shrug bilaterally   CN XII midline tongue protrusion   Motor:  Muscle bulk: normal, tone normal  5/5 strength in RUE and LUE. 5/5 strength in BL lower extremities.  Reflexes:  Right Left Comments  Pectoralis      Biceps (C5/6) 1 1   Brachioradialis (C5/6) 1 1    Triceps (C6/7) 1 1    Patellar (L3/4) 1 1    Achilles (S1) 1 1    Hoffman      Plantar withdraws withdraws   Jaw jerk    Sensation:  Light touch Intact to light touch in all extremities.   Pin prick    Temperature    Vibration   Proprioception    Coordination/Complex Motor:  - Finger to Nose with ataxia BL - Heel to shin unable to do. - Rapid alternating movement slowed. - Gait: deferred.  Labs   Lab Results  Component Value Date   NA 135 06/20/2020   K 3.6 06/20/2020   CL 101 06/20/2020   CO2 9 (L) 06/20/2020   GLUCOSE 105 (H) 06/20/2020   BUN 6 06/20/2020   CREATININE 1.14 06/20/2020   CALCIUM 8.0 (L) 06/20/2020   ALBUMIN 3.1 (L) 06/20/2020   AST 156 (H) 06/20/2020   ALT 27 06/20/2020   ALKPHOS 179 (H) 06/20/2020   BILITOT 6.2 (H) 06/20/2020   GFRNONAA >60 06/20/2020   GFRAA >60 06/20/2020     Imaging and Diagnostic studies  CTH without contrast: 1. Moderate  volume acute intraparenchymal hemorrhage in the anterior aspect of the right frontal lobe exerting local mass effect, however there is no significant midline shift. 2. Additional small volume acute intraparenchymal hemorrhage in the anterior left temporal lobe and superomedial aspect of the right occipital lobe, both with small volume associated subarachnoid hemorrhage. 3. Motion artifact limits the evaluation for subtle fracture in the cervical spine, however given this limitation no acute osseous injury is identified.   Impression  Matthew Horn is a 39 y.o. male with PMH significant for EtOH use who presents with falls, ICH (ICH score of 1)and seizures. The location and the distribution of the ICH is most suggestive of traumatic ICH. Likely from his falls and coagulopathy with low platelet to 41 and INR of 1.7. Labs strongly suggestive of hepatic dysfunction with elevated Tbili and AST.   Recommendations  - Agree with Keppra load of 1500mg . Recommend starting Keppra 500mg  BID (ordered) - Recommend SBP < 140, transfuse ffp to keep INR < 1.4 and transfuse platelets to keep platelet count above 100k. - Repeat CTH in 6 hours to ensure no worsening of the ICH. - Seizure precautions with seizure pads. - Recommend admission to ICU for close monitoring given high risk of worsening ICH with low platelet and high INR. - Recommend MRI Brain with and without contrast when able to assess for an underlying vascular malformation, metastatic process. - Ativan 2mg  IV once as needed for GTC Seizure lasting more than 2 mins. - CIWA precautions and protocol for EtOH withdrawal. ______________________________________________________________________   Thank you for the opportunity to take part in the care of this patient. If you have any further questions, please contact the neurology consultation attending.  Signed,  [facility] Pager Number 

## 2020-06-20 NOTE — ED Notes (Signed)
Boss - Noah Delaine- [phone]

## 2020-06-20 NOTE — Progress Notes (Signed)
EEG complete - results pending 

## 2020-06-20 NOTE — Progress Notes (Signed)
eLink Physician-Brief Progress Note Patient Name: Matthew Horn DOB: 1981/06/19 MRN: [id number]   Date of Service  06/20/2020  HPI/Events of Note  Patient admitted to the hospital via EMS and the ED following new onset seizures in the context of attempt at self detox from heavy ETOH abuse, CT imaging shows intra-parenchymal hemorrhages to thee frontal and occipital areas and area of SAH. Neurology is following. 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  eICU Interventions  New Patient Evaluation completed. I spoke to his mother to update her on his diagnosis and clinical status.        Thomasene Lot Kiana Hollar 06/20/2020, 8:16 PM

## 2020-06-20 NOTE — Consult Note (Signed)
   Providing Compassionate, Quality Care - Together  Neurosurgery Consult  Referring physician: ED Reason for referral: Contusions  Chief Complaint: Loss of consciousness  History of Present Illness: This is a 39 year old male with a significant alcohol history.  The history is per the chart continued a poor historian.  He had loss of consciousness while exiting a restaurant today and was noted to have a generalized tonic-clonic seizure.  EMS brought him to the emergency department.  CT of the brain revealed multiple traumatic subarachnoid contusions.  History reviewed. No pertinent surgical history.  Medications: I have reviewed the patient's current medications. Allergies: No Known Allergies  History reviewed. No pertinent family history. Social History:  has no history on file for tobacco use, alcohol use, and drug use.  ROS: Unable to obtain  Physical Exam:  Vital signs in last 24 hours: Temp:  [98 F (36.7 C)-98.3 F (36.8 C)] 98 F (36.7 C) (07/25 1814) Pulse Rate:  [58-128] 65 (07/26 0746) Resp:  [11-18] 14 (07/26 0217) BP: (138-182)/(65-125) 153/88 (07/26 0700) SpO2:  [91 %-98 %] 96 % (07/26 0746) PE: Eyes open to voice PERRLA EOMI Follows commands x4 Cervical collar in place Jaundice skin Occipital skin laceration with staples   Impression/Assessment:  39 year old male with  1.  Traumatic subarachnoid hemorrhages 2.  Seizures  Plan:  -Recommend repeat CT in 12 to 24 hours -Neuro ICU with every hour neuro checks -Seizure prophylaxis per neurology -No acute neurosurgical intervention    Thank you for allowing me to participate in this patient's care.  Please do not hesitate to call with questions or concerns.   Monia Pouch, DO Neurosurgeon [facility] Cell: [phone]

## 2020-06-20 NOTE — ED Notes (Signed)
Unable to calm pt, pt continues to pull at lines, take self of monitoring equipment, attempting to get out of bed, agitated. Attending Agarwala aware.

## 2020-06-20 NOTE — ED Triage Notes (Addendum)
Pt to ED via [facility] EMS-C/O Seizure. Pt was leaving restaurant with his boss and began to have seizure in parking lot . Pt did hit head when fell. EMS unable to control bleeding on scene. On EMS arrival pt was postictal , but able to answer questions appropriately. Pt had 3 witnessed seizures while with EMS, lasting about 1 min.  5MG  Versed given by EMS  Last VS : 158/80, hr 138, 98% , cbg 149.  On assessment pt obviously jaundice, sedated . # 20 R hand.

## 2020-06-21 ENCOUNTER — Inpatient Hospital Stay (HOSPITAL_COMMUNITY): Payer: Self-pay

## 2020-06-21 DIAGNOSIS — D62 Acute posthemorrhagic anemia: Secondary | ICD-10-CM

## 2020-06-21 DIAGNOSIS — S06360S Traumatic hemorrhage of cerebrum, unspecified, without loss of consciousness, sequela: Secondary | ICD-10-CM

## 2020-06-21 LAB — PREPARE PLATELET PHERESIS
Unit division: 0
Unit division: 0

## 2020-06-21 LAB — HEPATIC FUNCTION PANEL
ALT: 28 U/L (ref 0–44)
AST: 104 U/L — ABNORMAL HIGH (ref 15–41)
Albumin: 2.3 g/dL — ABNORMAL LOW (ref 3.5–5.0)
Alkaline Phosphatase: 119 U/L (ref 38–126)
Bilirubin, Direct: 2.6 mg/dL — ABNORMAL HIGH (ref 0.0–0.2)
Indirect Bilirubin: 2.9 mg/dL — ABNORMAL HIGH (ref 0.3–0.9)
Total Bilirubin: 5.5 mg/dL — ABNORMAL HIGH (ref 0.3–1.2)
Total Protein: 5.1 g/dL — ABNORMAL LOW (ref 6.5–8.1)

## 2020-06-21 LAB — COMPREHENSIVE METABOLIC PANEL
ALT: 24 U/L (ref 0–44)
AST: 102 U/L — ABNORMAL HIGH (ref 15–41)
Albumin: 2.2 g/dL — ABNORMAL LOW (ref 3.5–5.0)
Alkaline Phosphatase: 120 U/L (ref 38–126)
Anion gap: 11 (ref 5–15)
BUN: <5 mg/dL — ABNORMAL LOW (ref 6–20)
CO2: 21 mmol/L — ABNORMAL LOW (ref 22–32)
Calcium: 7.1 mg/dL — ABNORMAL LOW (ref 8.9–10.3)
Chloride: 103 mmol/L (ref 98–111)
Creatinine, Ser: 0.77 mg/dL (ref 0.61–1.24)
GFR calc Af Amer: >60 mL/min (ref >60)
GFR calc non Af Amer: >60 mL/min (ref >60)
Glucose, Bld: 100 mg/dL — ABNORMAL HIGH (ref 70–99)
Potassium: 3.5 mmol/L (ref 3.5–5.1)
Sodium: 135 mmol/L (ref 135–145)
Total Bilirubin: 5.2 mg/dL — ABNORMAL HIGH (ref 0.3–1.2)
Total Protein: 5 g/dL — ABNORMAL LOW (ref 6.5–8.1)

## 2020-06-21 LAB — HEPATITIS PANEL, ACUTE
HCV Ab: NONREACTIVE
Hep A IgM: NONREACTIVE
Hep B C IgM: NONREACTIVE
Hepatitis B Surface Ag: NONREACTIVE

## 2020-06-21 LAB — CBC
HCT: 18.2 % — ABNORMAL LOW (ref 39.0–52.0)
HCT: 26.5 % — ABNORMAL LOW (ref 39.0–52.0)
HCT: 26.5 % — ABNORMAL LOW (ref 39.0–52.0)
Hemoglobin: 5.6 g/dL — CL (ref 13.0–17.0)
Hemoglobin: 8.4 g/dL — ABNORMAL LOW (ref 13.0–17.0)
Hemoglobin: 8.6 g/dL — ABNORMAL LOW (ref 13.0–17.0)
MCH: 24.7 pg — ABNORMAL LOW (ref 26.0–34.0)
MCH: 26 pg (ref 26.0–34.0)
MCH: 26.8 pg (ref 26.0–34.0)
MCHC: 30.8 g/dL (ref 30.0–36.0)
MCHC: 31.7 g/dL (ref 30.0–36.0)
MCHC: 32.5 g/dL (ref 30.0–36.0)
MCV: 80.2 fL (ref 80.0–100.0)
MCV: 82 fL (ref 80.0–100.0)
MCV: 82.6 fL (ref 80.0–100.0)
Platelets: 37 10*3/uL — ABNORMAL LOW (ref 150–400)
Platelets: 41 10*3/uL — ABNORMAL LOW (ref 150–400)
Platelets: 41 10*3/uL — ABNORMAL LOW (ref 150–400)
RBC: 2.27 MIL/uL — ABNORMAL LOW (ref 4.22–5.81)
RBC: 3.23 MIL/uL — ABNORMAL LOW (ref 4.22–5.81)
RBC: 3.21 MIL/uL — ABNORMAL LOW (ref 4.22–5.81)
RDW: 20.5 % — ABNORMAL HIGH (ref 11.5–15.5)
RDW: 19.3 % — ABNORMAL HIGH (ref 11.5–15.5)
RDW: 19 % — ABNORMAL HIGH (ref 11.5–15.5)
WBC: 2.8 10*3/uL — ABNORMAL LOW (ref 4.0–10.5)
WBC: 3.9 10*3/uL — ABNORMAL LOW (ref 4.0–10.5)
WBC: 3.8 10*3/uL — ABNORMAL LOW (ref 4.0–10.5)
nRBC: 0 % (ref 0.0–0.2)
nRBC: 0.5 % — ABNORMAL HIGH (ref 0.0–0.2)
nRBC: 0.8 % — ABNORMAL HIGH (ref 0.0–0.2)

## 2020-06-21 LAB — GLUCOSE, CAPILLARY
Glucose-Capillary: 99 mg/dL (ref 70–99)
Glucose-Capillary: 87 mg/dL (ref 70–99)
Glucose-Capillary: 82 mg/dL (ref 70–99)
Glucose-Capillary: 69 mg/dL — ABNORMAL LOW (ref 70–99)
Glucose-Capillary: 129 mg/dL — ABNORMAL HIGH (ref 70–99)
Glucose-Capillary: 114 mg/dL — ABNORMAL HIGH (ref 70–99)

## 2020-06-21 LAB — CBC WITH DIFFERENTIAL/PLATELET
Abs Immature Granulocytes: 0.02 10*3/uL (ref 0.00–0.07)
Basophils Absolute: 0 10*3/uL (ref 0.0–0.1)
Basophils Relative: 0 %
Eosinophils Absolute: 0 10*3/uL (ref 0.0–0.5)
Eosinophils Relative: 2 %
HCT: 14.5 % — ABNORMAL LOW (ref 39.0–52.0)
Hemoglobin: 4.5 g/dL — CL (ref 13.0–17.0)
Immature Granulocytes: 1 %
Lymphocytes Relative: 24 %
Lymphs Abs: 0.6 10*3/uL — ABNORMAL LOW (ref 0.7–4.0)
MCH: 23.7 pg — ABNORMAL LOW (ref 26.0–34.0)
MCHC: 31 g/dL (ref 30.0–36.0)
MCV: 76.3 fL — ABNORMAL LOW (ref 80.0–100.0)
Monocytes Absolute: 0.3 10*3/uL (ref 0.1–1.0)
Monocytes Relative: 11 %
Neutro Abs: 1.4 10*3/uL — ABNORMAL LOW (ref 1.7–7.7)
Neutrophils Relative %: 62 %
Platelets: 35 10*3/uL — ABNORMAL LOW (ref 150–400)
RBC: 1.9 MIL/uL — ABNORMAL LOW (ref 4.22–5.81)
RDW: 20.2 % — ABNORMAL HIGH (ref 11.5–15.5)
WBC: 2.3 10*3/uL — ABNORMAL LOW (ref 4.0–10.5)
nRBC: 0 % (ref 0.0–0.2)

## 2020-06-21 LAB — PREPARE FRESH FROZEN PLASMA

## 2020-06-21 LAB — LACTATE DEHYDROGENASE: LDH: 240 U/L — ABNORMAL HIGH (ref 98–192)

## 2020-06-21 LAB — BPAM FFP
Blood Product Expiration Date: 202109052359
ISSUE DATE / TIME: 202108312025
Unit Type and Rh: 5100

## 2020-06-21 LAB — BPAM PLATELET PHERESIS
Blood Product Expiration Date: 202109022359
Blood Product Expiration Date: 202109032359
ISSUE DATE / TIME: 202108311559
ISSUE DATE / TIME: 202108311722
Unit Type and Rh: 8400
Unit Type and Rh: 6200

## 2020-06-21 LAB — PREPARE RBC (CROSSMATCH)

## 2020-06-21 LAB — MAGNESIUM: Magnesium: 1.6 mg/dL — ABNORMAL LOW (ref 1.7–2.4)

## 2020-06-21 LAB — TECHNOLOGIST SMEAR REVIEW

## 2020-06-21 LAB — HIV ANTIBODY (ROUTINE TESTING W REFLEX): HIV Screen 4th Generation wRfx: NONREACTIVE

## 2020-06-21 LAB — PROTIME-INR
INR: 1.6 — ABNORMAL HIGH (ref 0.8–1.2)
Prothrombin Time: 18.7 seconds — ABNORMAL HIGH (ref 11.4–15.2)

## 2020-06-21 LAB — PHOSPHORUS: Phosphorus: 3.6 mg/dL (ref 2.5–4.6)

## 2020-06-21 LAB — AMMONIA: Ammonia: 40 umol/L — ABNORMAL HIGH (ref 9–35)

## 2020-06-21 LAB — HEPATITIS B SURFACE ANTIGEN: Hepatitis B Surface Ag: NONREACTIVE

## 2020-06-21 IMAGING — CT CT ABD-PELV W/O CM
2 of 4 series · 15 of 46 positions shown, 17 images · non-contrast
Comparison: [DATE]

CLINICAL DATA: Evaluate for retroperitoneal hematoma.  Follow-up.

EXAM:
CT ABDOMEN AND PELVIS WITHOUT CONTRAST
TECHNIQUE: Multidetector CT imaging of the abdomen and pelvis was performed
following the standard protocol without IV contrast.

[Series 4: ap without · axial · non-contrast · 0.74mm/px · z∈[-526,-6]mm · 12 of 118 slices shown, 14 images]
[im 7/118  soft-tissue]
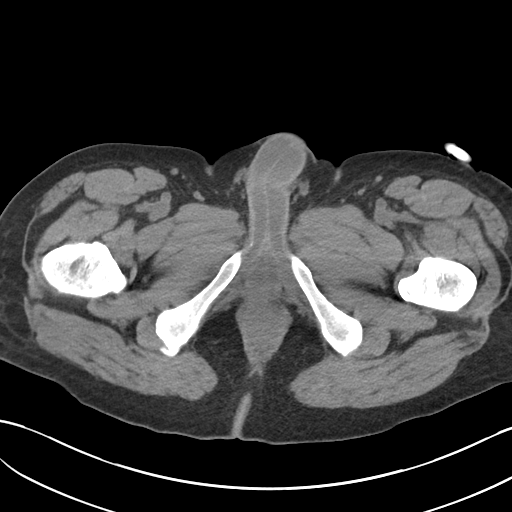
[im 7/118  bone]
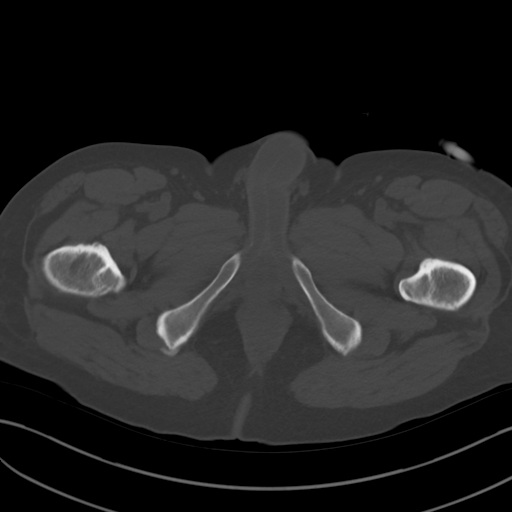
[im 19/118  soft-tissue]
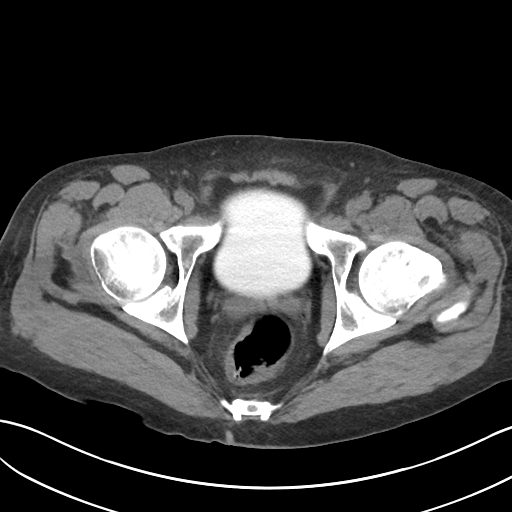
[im 25/118  soft-tissue]
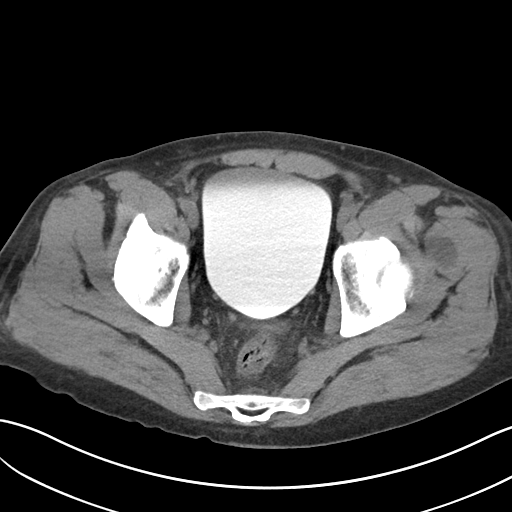
[im 37/118  soft-tissue]
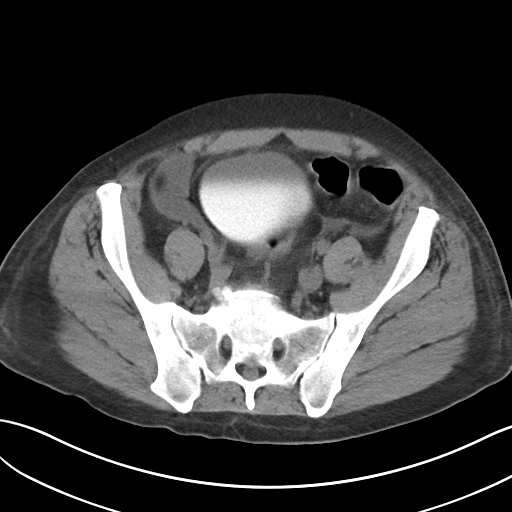
[im 44/118  soft-tissue]
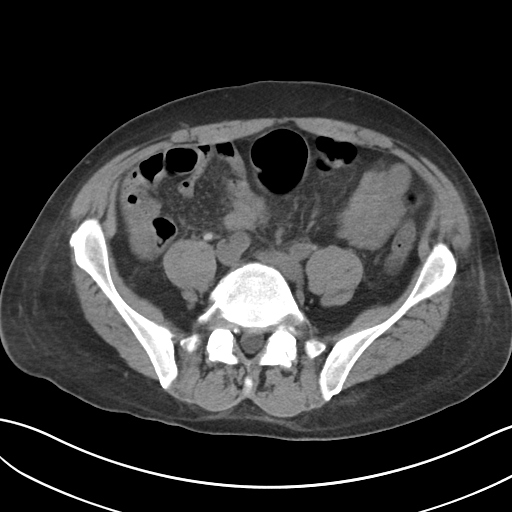
[im 56/118  soft-tissue]
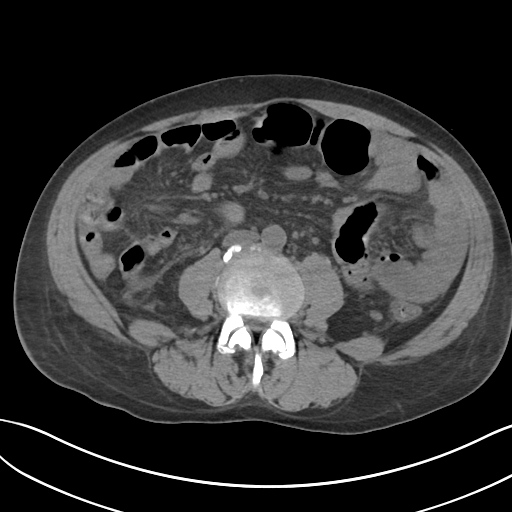
[im 62/118  soft-tissue]
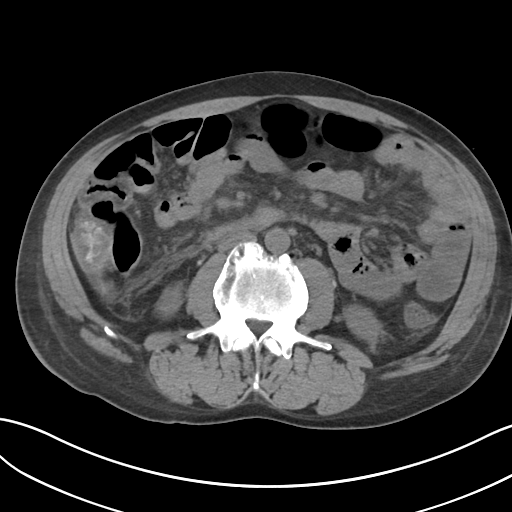
[im 74/118  soft-tissue]
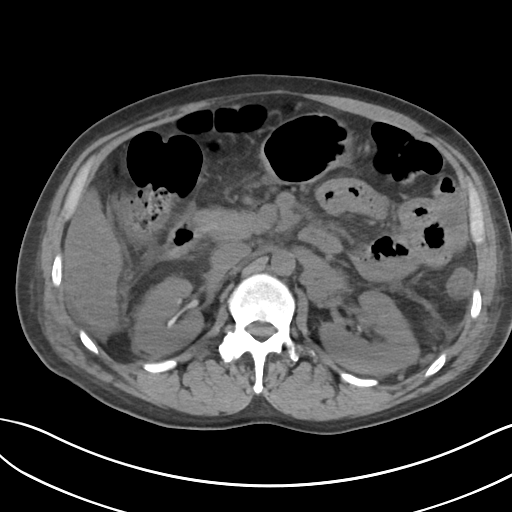
[im 81/118  soft-tissue]
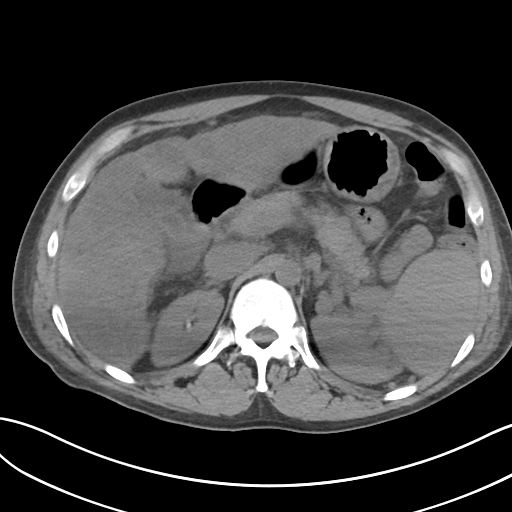
[im 81/118  bone]
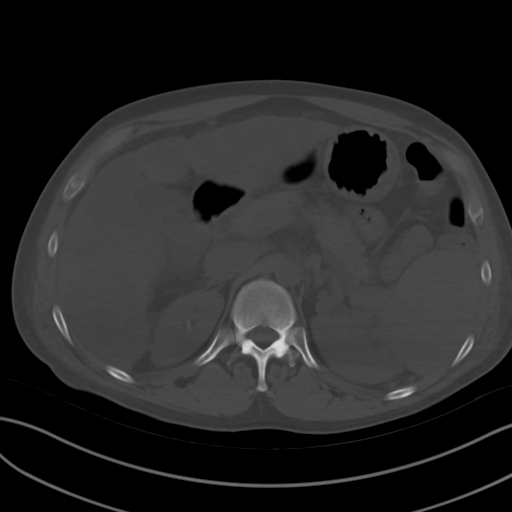
[im 93/118  soft-tissue]
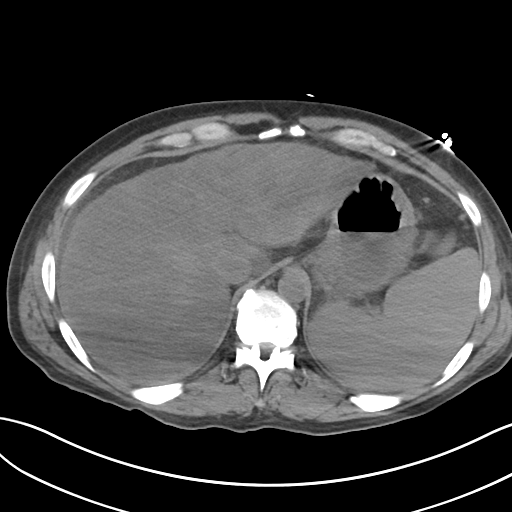
[im 99/118  soft-tissue]
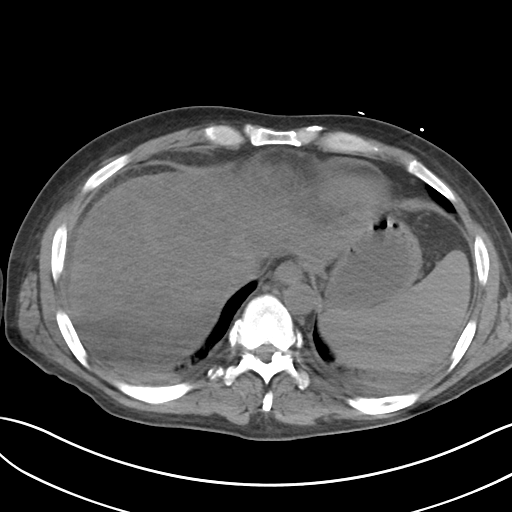
[im 111/118  soft-tissue]
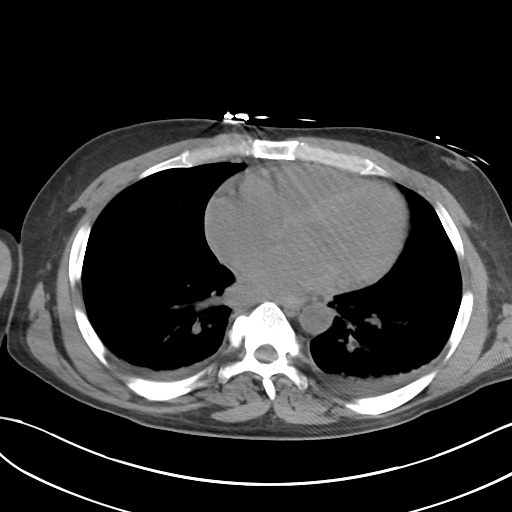

[Series 7: cor · coronal · 0.83mm/px · 3 of 94 slices shown]
[im 32/94  soft-tissue]
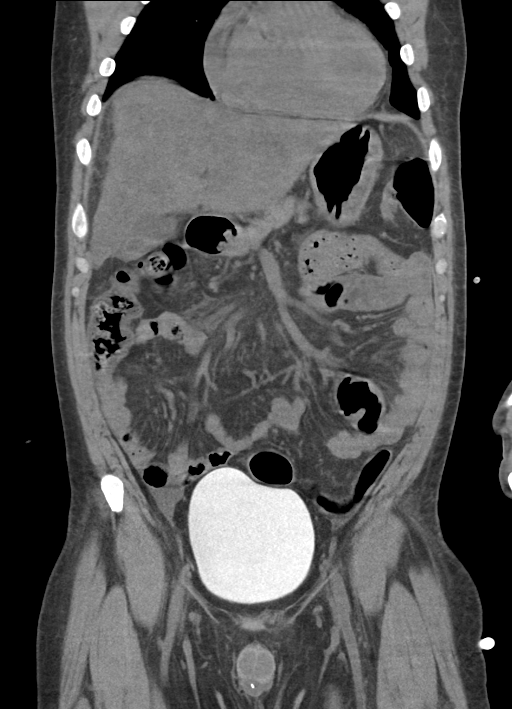
[im 42/94  soft-tissue]
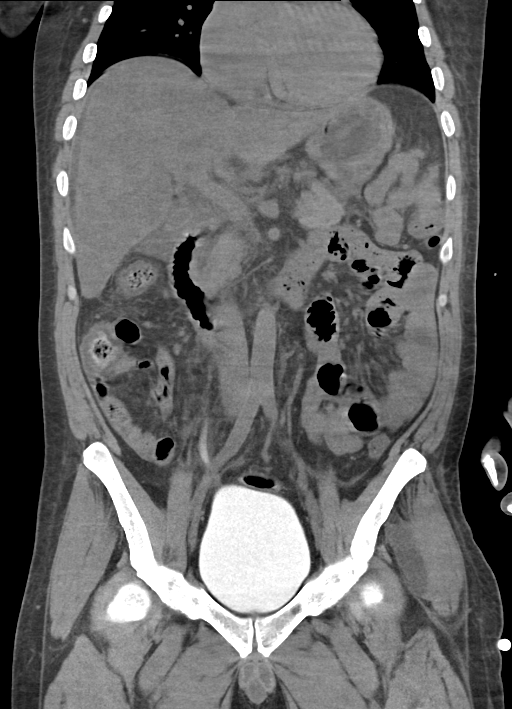
[im 52/94  soft-tissue]
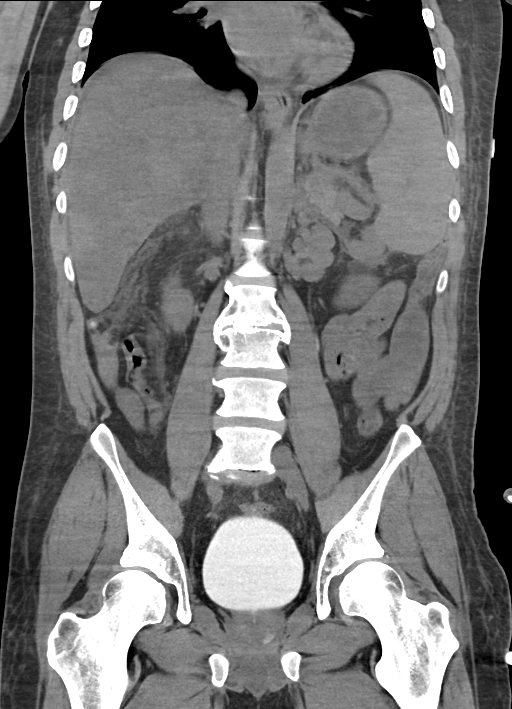

[15 of 46 positions shown; findings below may reference images not displayed]

FINDINGS: Lower chest: Bibasilar subsegmental atelectasis. Normal heart size.
Tiny bilateral pleural effusions, new. Decreased density within the
intravascular space, suggesting anemia.

Hepatobiliary: Marked cirrhosis and heterogeneous hepatic steatosis.
Gallbladder wall thickening again identified. No calcified stone or
biliary duct dilatation.

Pancreas: Normal, without mass or ductal dilatation.

Spleen: Splenomegaly at 14.1 cm craniocaudal.

Adrenals/Urinary Tract: Normal adrenal glands. Contrast within renal
collecting systems from yesterday's CT. A lower pole left sided
calyceal diverticulum on 53/4. Contrast within the urinary bladder.

Stomach/Bowel: Normal stomach, without wall thickening. Ascending
colonic wall thickening again identified, including on 57/4. Normal
terminal ileum and appendix. Normal small bowel.

Vascular/Lymphatic: Normal caliber of the aorta and branch vessels.
Portal venous hypertension, with extensive perisplenic collaterals.
No abdominopelvic adenopathy.

Reproductive: Normal prostate.

Other: Trace perihepatic ascites is unchanged. No retroperitoneal
fluid or hemorrhage. Trace pelvic fluid, slightly decreased.

Musculoskeletal: Mild bilateral gynecomastia. Bilateral L5 pars
defects. Significantly age advanced lumbar spondylosis.
IMPRESSION: 1. No evidence of retroperitoneal fluid or hemorrhage.
2. Cirrhosis and portal venous hypertension.
3. Similar trace perihepatic and slightly decreased pelvic ascites.
4. Similar ascending colonic wall thickening. Most likely secondary
to portal venous hypertension and venous congestion. Infectious
colitis cannot be excluded.
5. New tiny bilateral pleural effusions.
6. Mild bilateral gynecomastia.
7. Contrast within the intravascular space, suggesting anemia.
8. Lower pole left renal calyceal diverticulum.

## 2020-06-21 IMAGING — CT CT HEAD W/O CM
4 series · 15 of 47 positions shown, 17 images · non-contrast
Comparison: Head CT performed earlier the same day [DATE],
brain MRI [DATE].

CLINICAL DATA: Parenchymal hemorrhage, follow-up.

EXAM:
CT HEAD WITHOUT CONTRAST
TECHNIQUE: Contiguous axial images were obtained from the base of the skull
through the vertex without intravenous contrast.

[Series 3: head wo · axial · 0.46mm/px · z∈[+287,+422]mm · 7 of 37 slices shown, 9 images]
[im 5/37  brain]
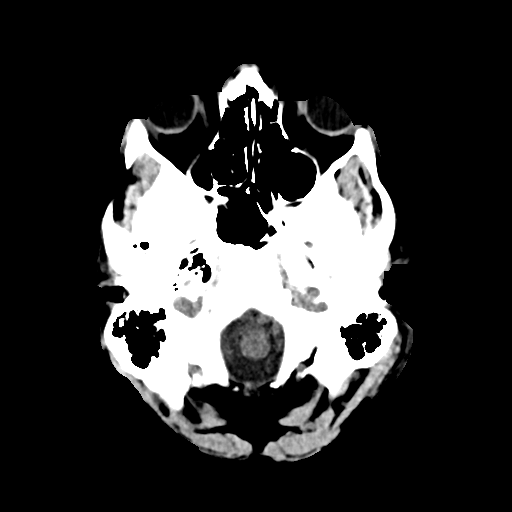
[im 5/37  bone]
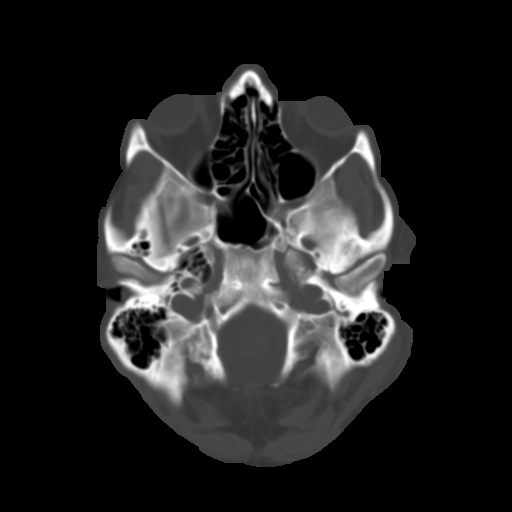
[im 10/37  brain]
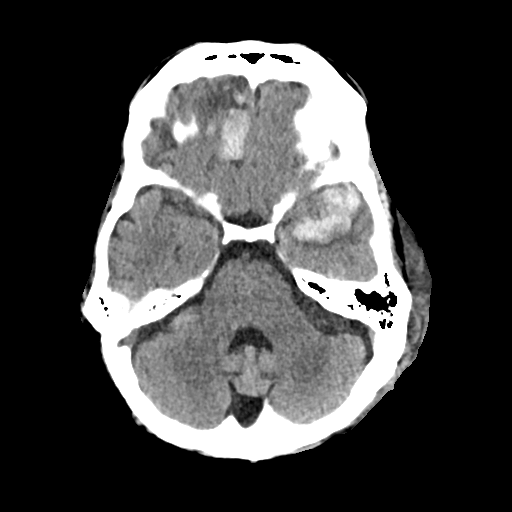
[im 14/37  brain]
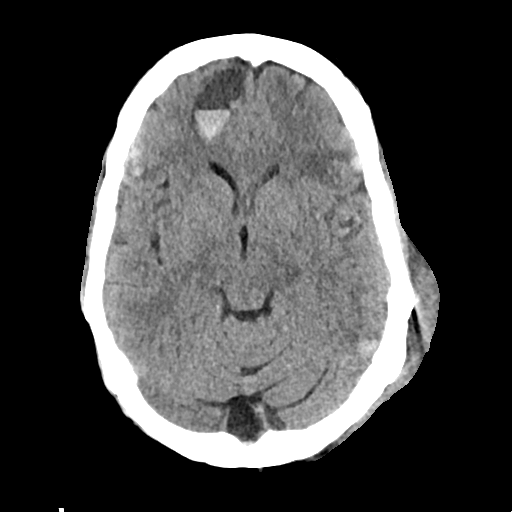
[im 19/37  brain]
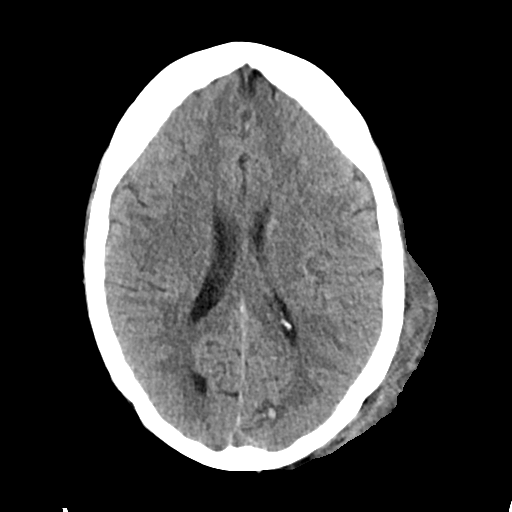
[im 23/37  brain]
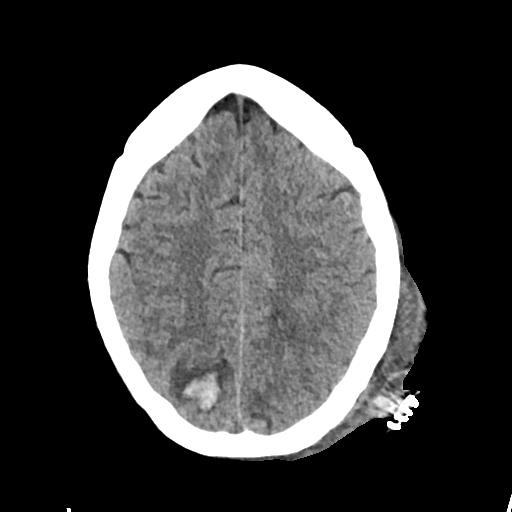
[im 23/37  bone]
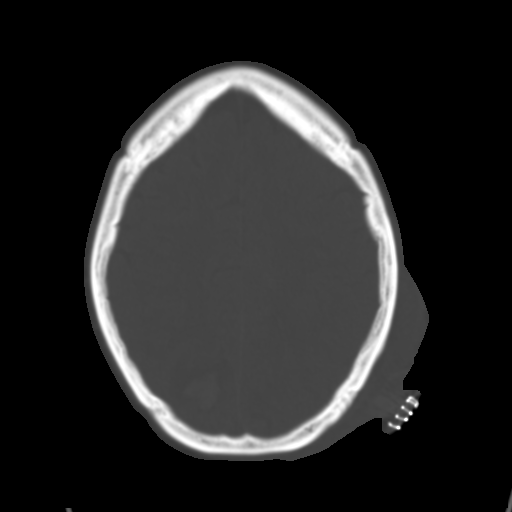
[im 28/37  brain]
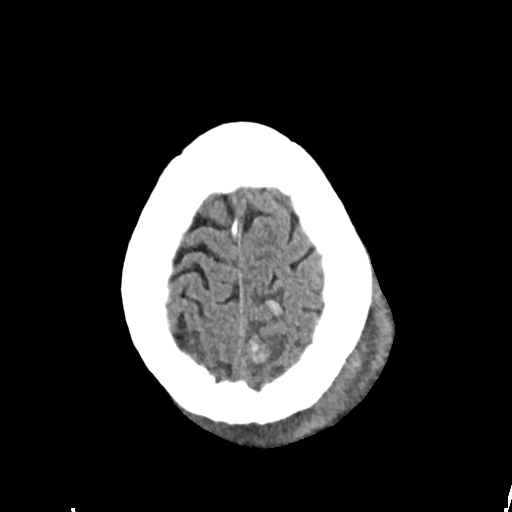
[im 32/37  brain]
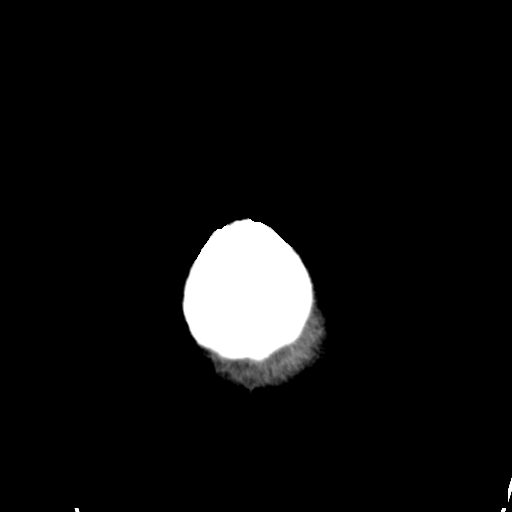

[Series 4: head bone · axial · 0.46mm/px · z∈[+285,+303]mm · 2 of 92 slices shown]
[im 10/92  bone]
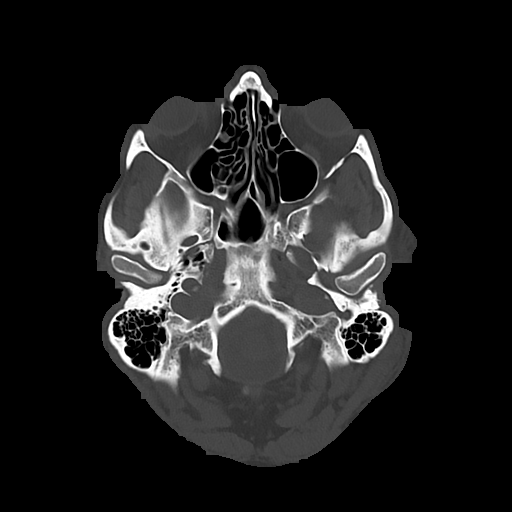
[im 19/92  bone]
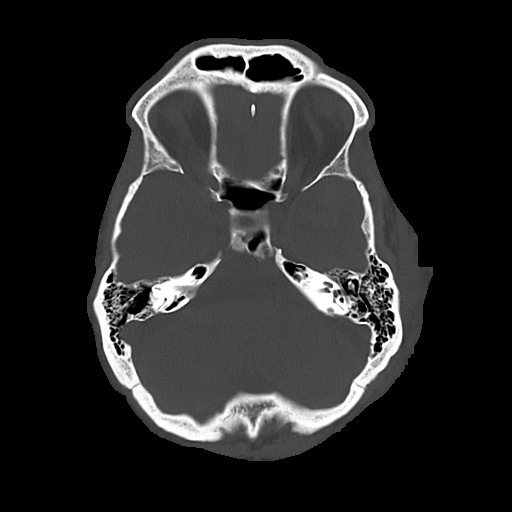

[Series 5: cor soft · coronal · 0.33mm/px · 3 of 73 slices shown]
[im 25/73  brain]
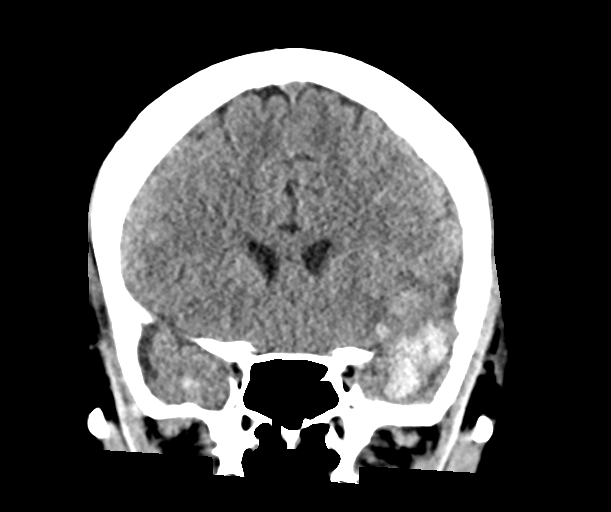
[im 33/73  brain]
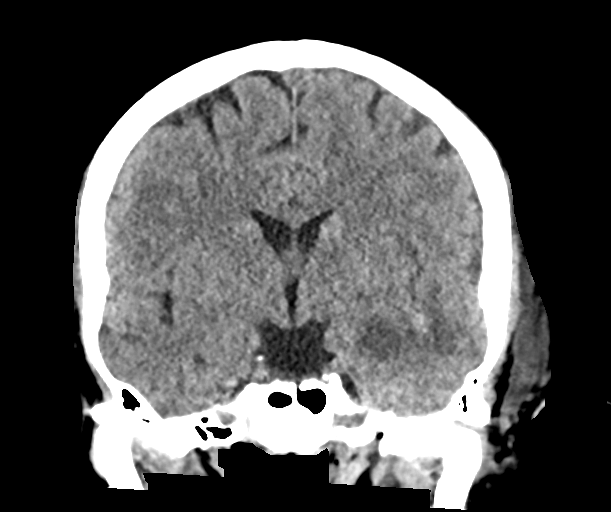
[im 41/73  brain]
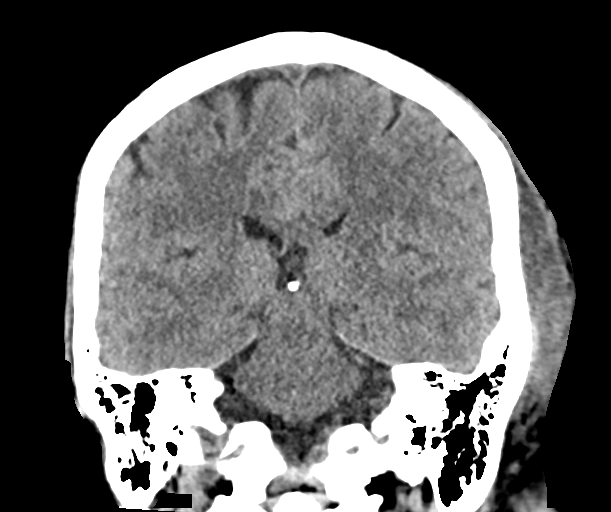

[Series 6: sag soft · sagittal · 0.33mm/px · 3 of 67 slices shown]
[im 23/67  brain]
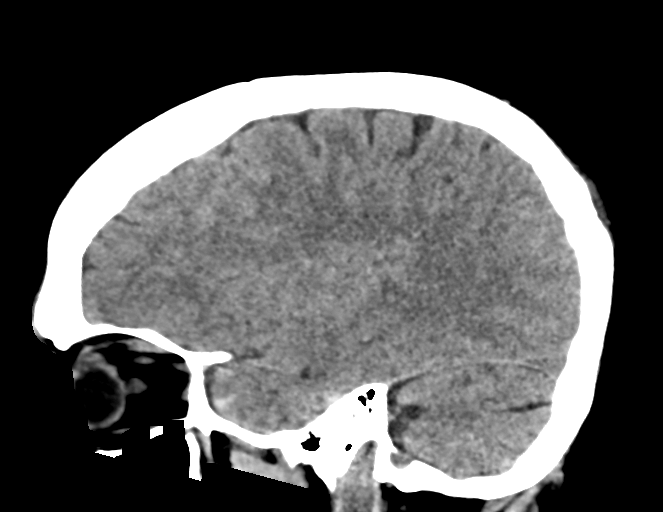
[im 34/67  brain]
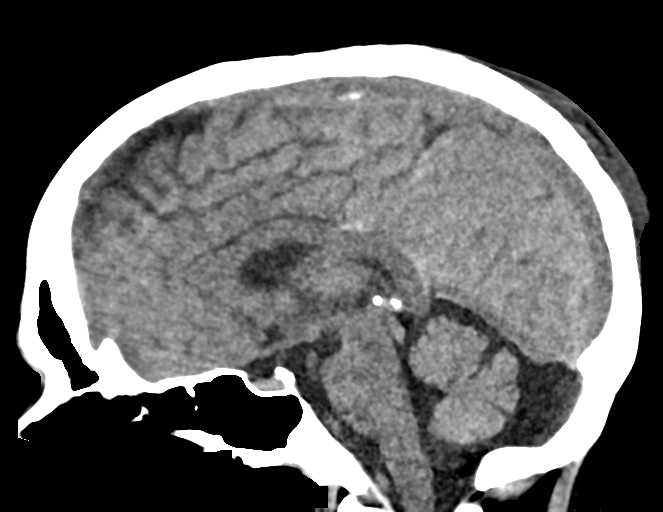
[im 45/67  brain]
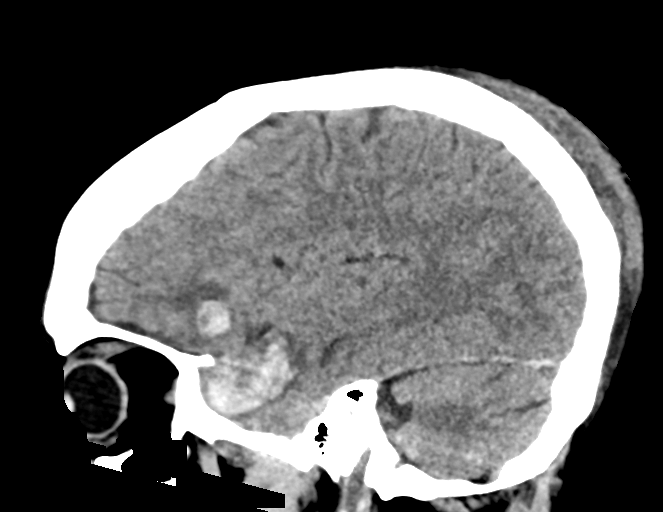

[15 of 47 positions shown; findings below may reference images not displayed]

FINDINGS: Brain:

Numerous acute/early subacute parenchymal hemorrhages within the
bilateral cerebral hemispheres have not significantly changed in
size as compared to the head CT performed earlier the same day
[DATE]. However, edema surrounding several hemorrhages may be
subtly increased. As before, the largest parenchymal hemorrhages are
present within the anterior right frontal lobe, within the anterior
left temporal lobe and adjacent anterolateral left frontal lobe and
within the right parietooccipital lobe. As before, there is regional
mass effect associated with the hemorrhages but no midline shift or
herniation.

Vascular: No hyperdense vessel.

Skull: Normal. Negative for fracture or focal lesion.

Sinuses/Orbits: Visualized orbits show no acute finding. No
significant paranasal sinus disease or mastoid effusion.

Other: Redemonstrated large left posterior scalp hematoma with
overlying scalp staples.
IMPRESSION: Numerous acute/early subacute parenchymal hemorrhages within
bilateral cerebral hemispheres have not significantly changed in
size as compared to the head CT performed earlier the same day
[DATE]. However, edema surrounding several of the hemorrhages
may be subtly increased.

As before, there is regional mass effect associated with the
hemorrhages, but no midline shift or herniation.

Redemonstrated large posterior left scalp hematoma with overlying
scalp staples.

## 2020-06-21 IMAGING — CT CT HEAD W/O CM
4 series · 16 of 47 positions shown, 18 images · non-contrast
Comparison: [DATE]

CLINICAL DATA: Hemorrhage follow-up

EXAM:
CT HEAD WITHOUT CONTRAST
TECHNIQUE: Contiguous axial images were obtained from the base of the skull
through the vertex without intravenous contrast.

[Series 3: head wo · axial · 0.48mm/px · z∈[+1130,+1256]mm · 7 of 35 slices shown, 9 images]
[im 5/35  brain]
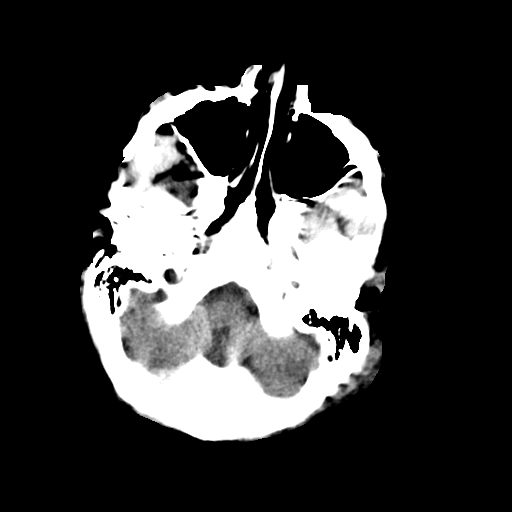
[im 5/35  bone]
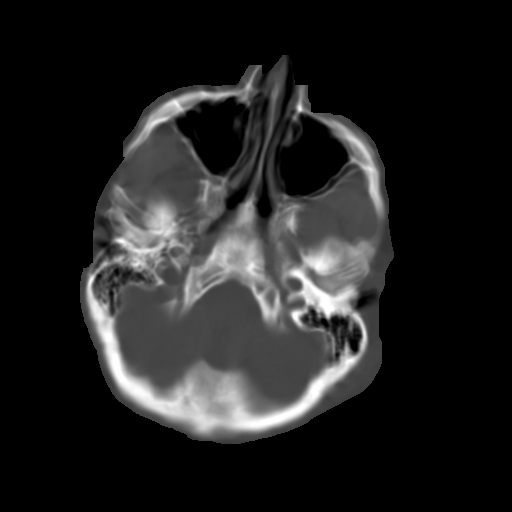
[im 9/35  brain]
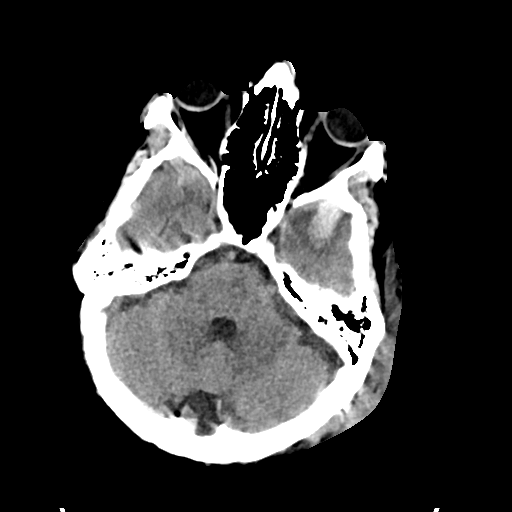
[im 13/35  brain]
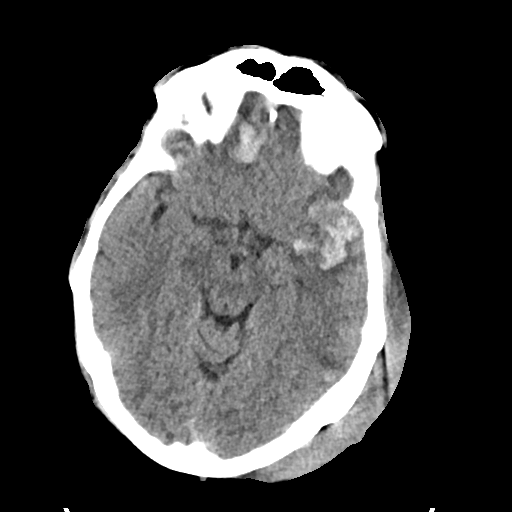
[im 18/35  brain]
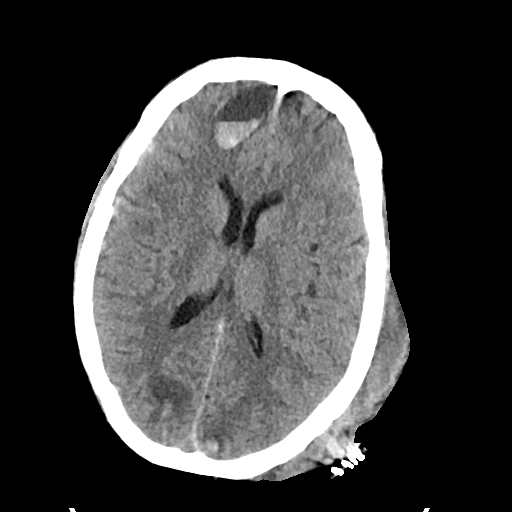
[im 22/35  brain]
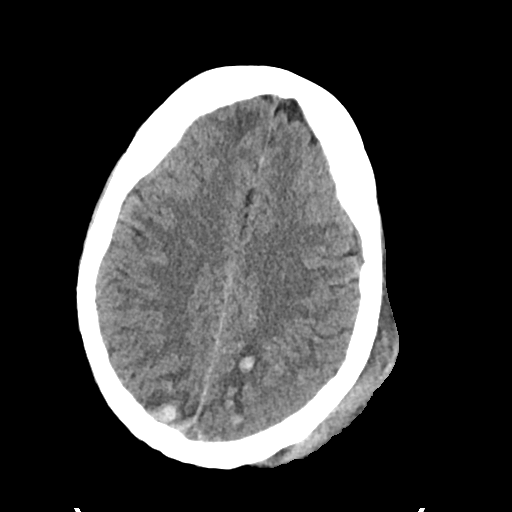
[im 22/35  bone]
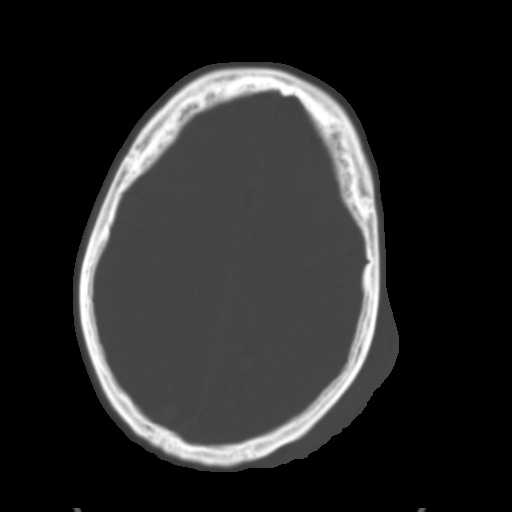
[im 26/35  brain]
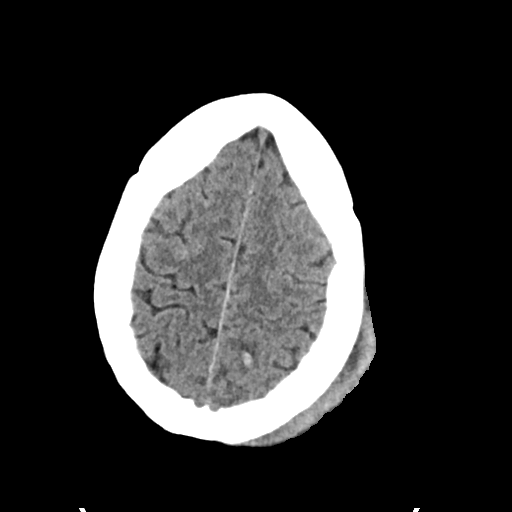
[im 30/35  brain]
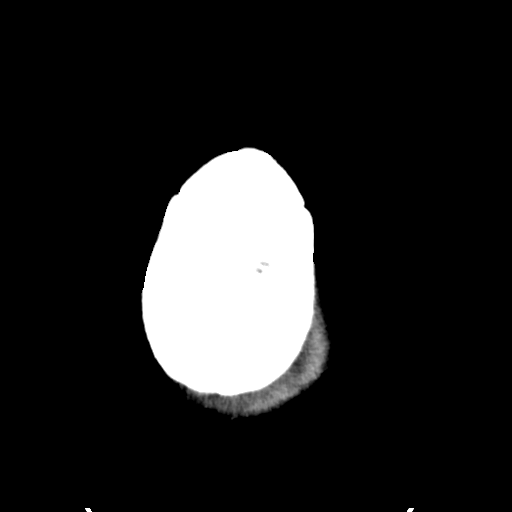

[Series 4: head bone · axial · 0.48mm/px · z∈[+1126,+1160]mm · 3 of 86 slices shown]
[im 9/86  bone]
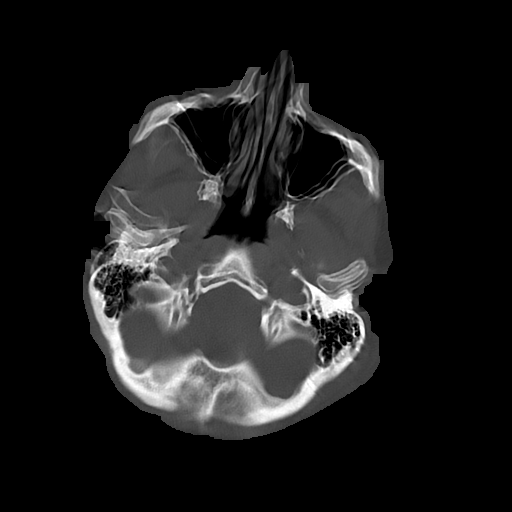
[im 18/86  bone]
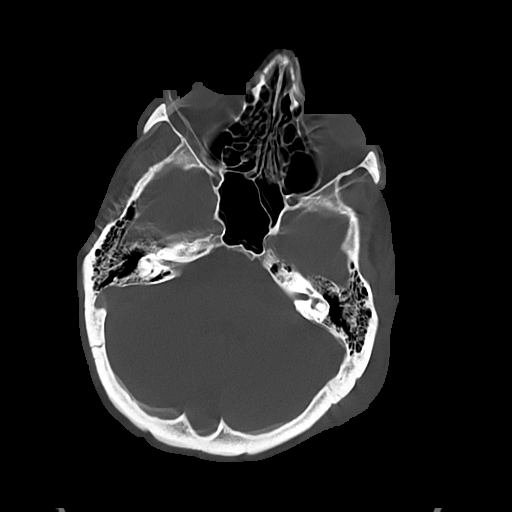
[im 26/86  bone]
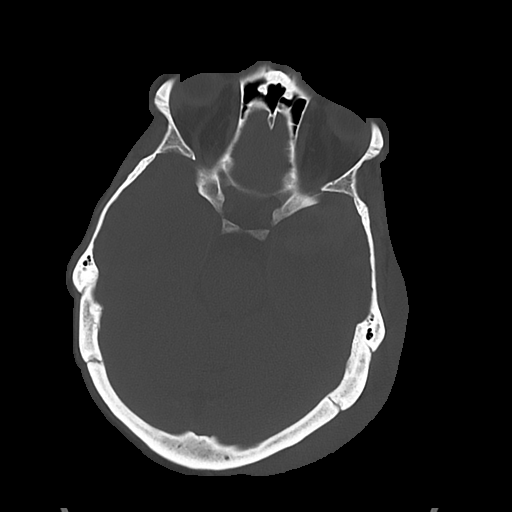

[Series 5: cor soft · coronal · 0.31mm/px · 3 of 77 slices shown]
[im 26/77  brain]
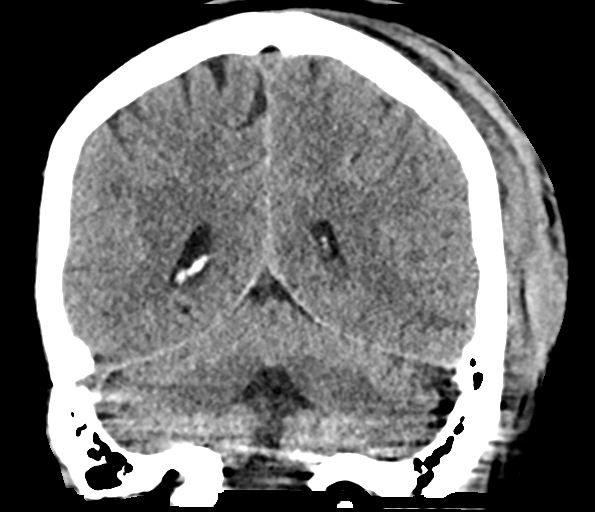
[im 34/77  brain]
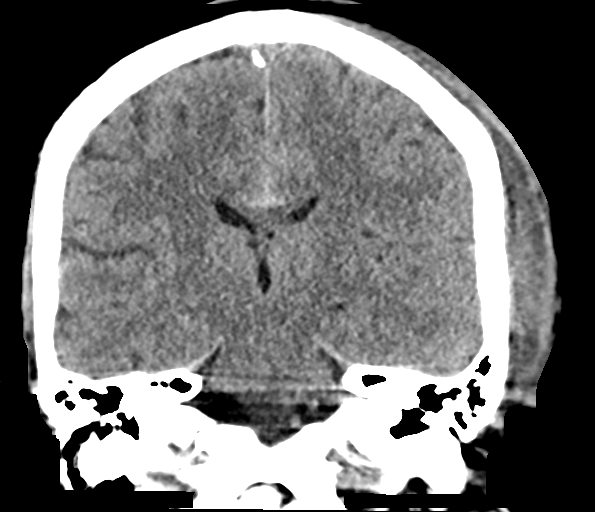
[im 43/77  brain]
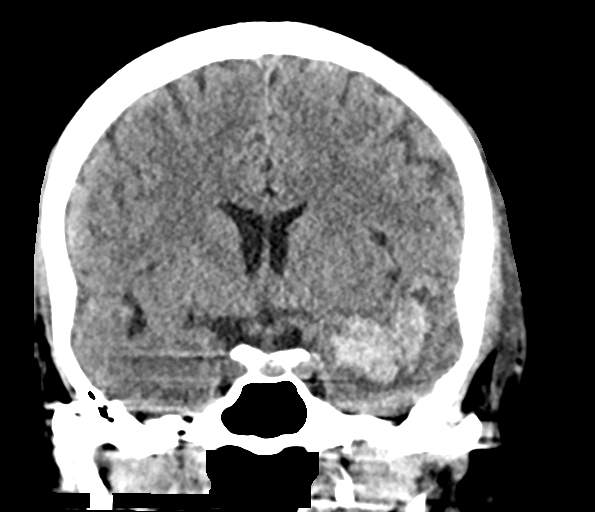

[Series 6: sag soft · sagittal · 0.31mm/px · 3 of 63 slices shown]
[im 21/63  brain]
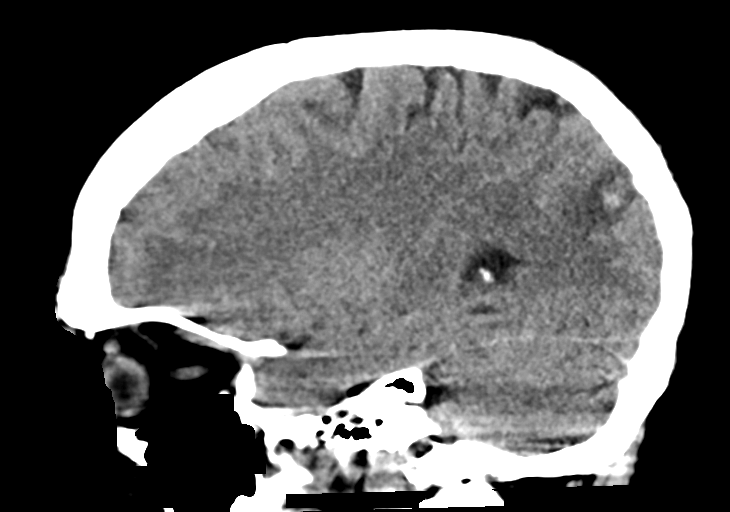
[im 32/63  brain]
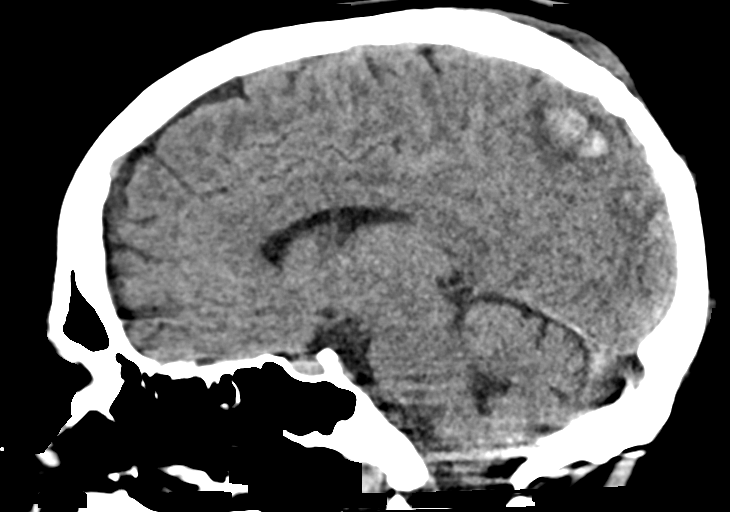
[im 42/63  brain]
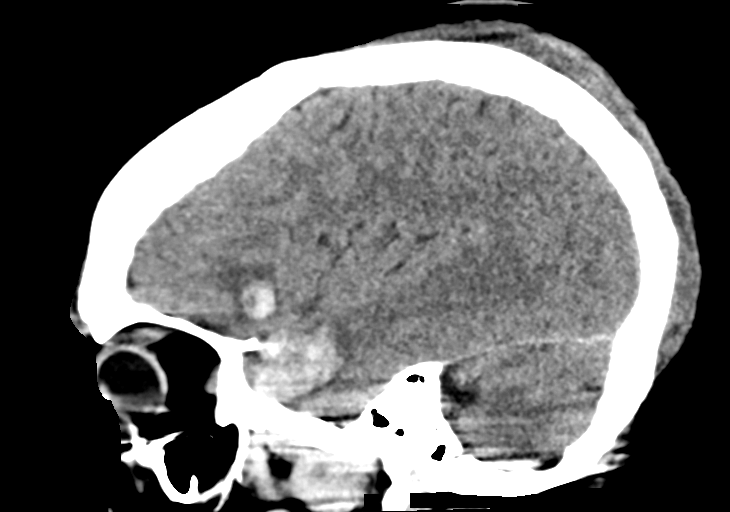

[16 of 47 positions shown; findings below may reference images not displayed]

FINDINGS: Brain: Marked worsening of multifocal intraparenchymal hemorrhage,
most evident in the left temporal lobe, left frontal operculum and
right occipital lobe. There are numerous new hemorrhagic foci within
the occipital lobes and posterior parietal lobes. There is no
midline shift or other mass effect.

Vascular: No hyperdense vessel or unexpected calcification.

Skull: Large amount of left parietal scalp swelling

Sinuses/Orbits: No acute finding.

Other: None.
IMPRESSION: 1. Worsening multifocal intraparenchymal hemorrhage, in addition to
numerous new hemorrhagic sites.
2. No midline shift or other mass effect.

These results were communicated to Dr. MITRANI at [DATE] on [DATE] by text page via the [employer] messaging system.

## 2020-06-21 IMAGING — MR MR HEAD W/O CM
10 of 11 series · 43 of 48 positions shown · non-contrast
Comparison: Head CT [DATE]

CLINICAL DATA: Seizure with head trauma

EXAM:
MRI HEAD WITHOUT CONTRAST
TECHNIQUE: Multiplanar, multiecho pulse sequences of the brain and surrounding
structures were obtained without intravenous contrast.

[Series 5: DWI · axial · 3.0mm · 0.88mm/px · z∈[-110,+36]mm · 10 of 100 slices shown (1 of 4)]
[im 1/100]
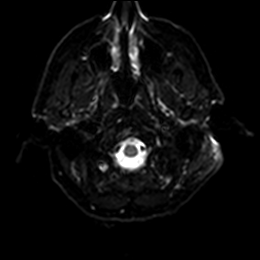
[im 12/100]
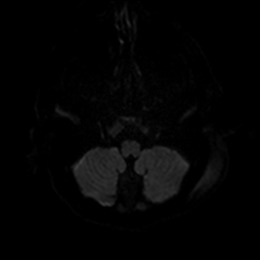
[im 23/100]
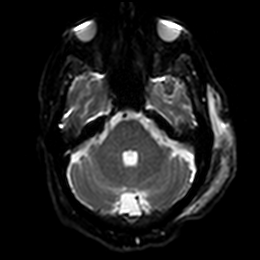
[im 34/100]
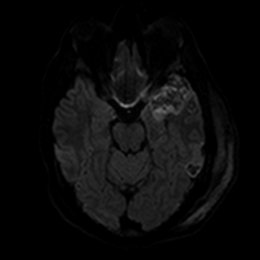
[im 45/100]
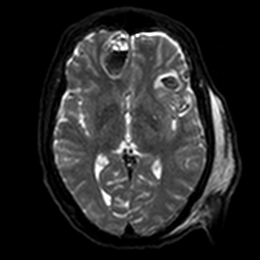
[im 56/100]
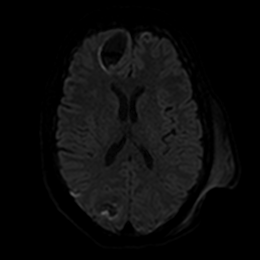
[im 67/100]
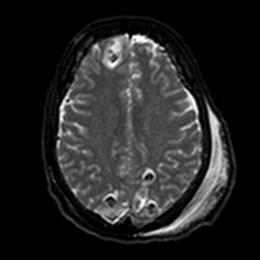
[im 78/100]
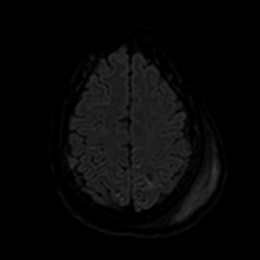
[im 89/100]
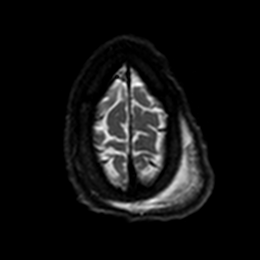
[im 100/100]
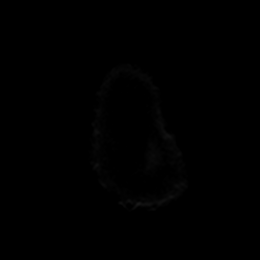

[Series 6: DWI · axial · 3.0mm · 0.88mm/px · z∈[-110,+36]mm · 5 of 50 slices shown (2 of 4)]
[im 1/50]
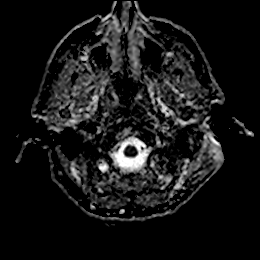
[im 13/50]
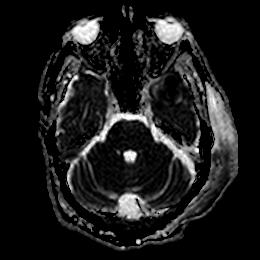
[im 25/50]
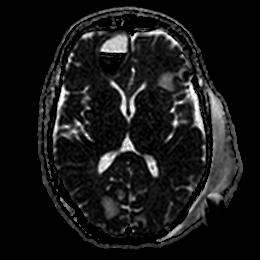
[im 37/50]
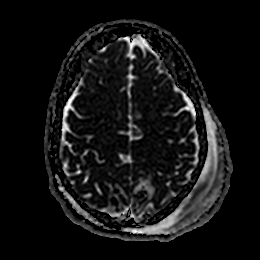
[im 50/50]
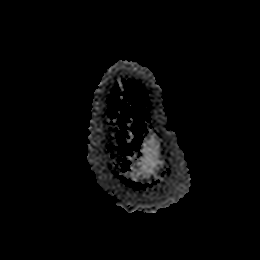

[Series 7: DWI · coronal · 4.0mm · 0.88mm/px · 6 of 70 slices shown (3 of 4)]
[im 1/70]
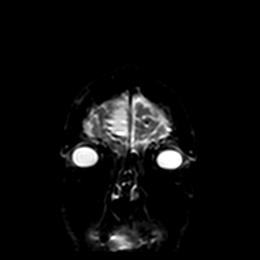
[im 14/70]
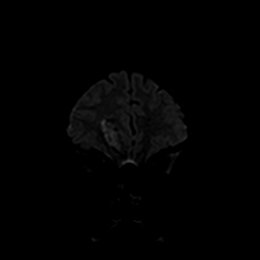
[im 28/70]
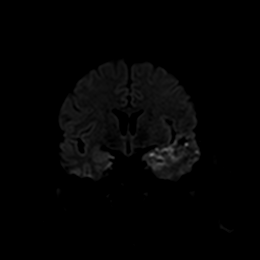
[im 42/70]
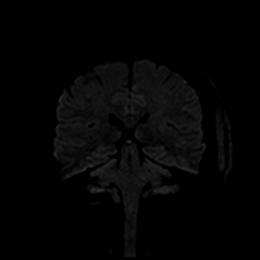
[im 56/70]
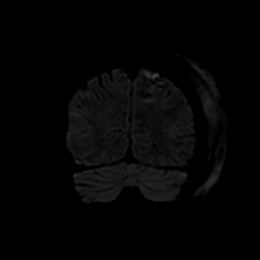
[im 70/70]
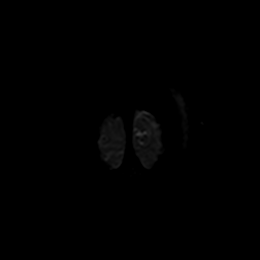

[Series 8: DWI · coronal · 4.0mm · 0.88mm/px · 3 of 35 slices shown (4 of 4)]
[im 1/35]
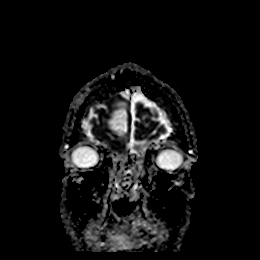
[im 18/35]
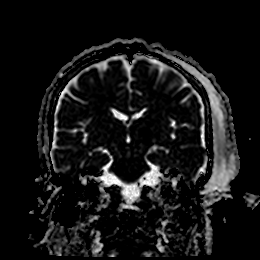
[im 35/35]
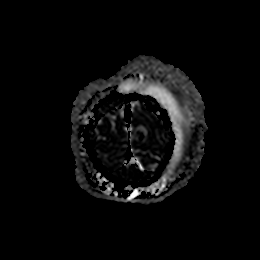

[Series 9: T1 · sagittal · 5.0mm · 0.78mm/px · 2 of 25 slices shown]
[im 1/25]
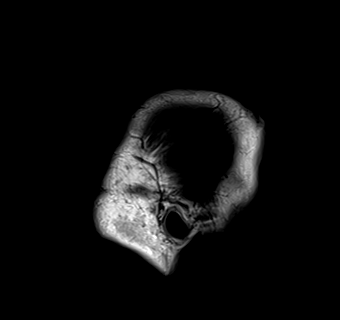
[im 25/25]
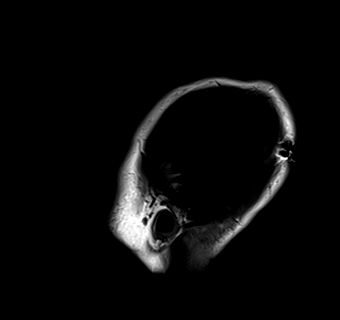

[Series 10: T2 · axial · 5.0mm · 0.72mm/px · z∈[-108,+34]mm · 2 of 25 slices shown (1 of 2)]
[im 1/25]
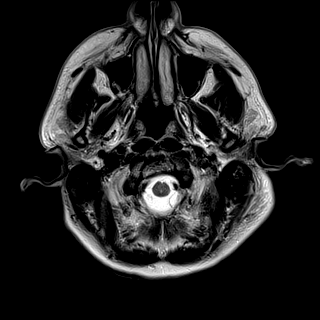
[im 25/25]
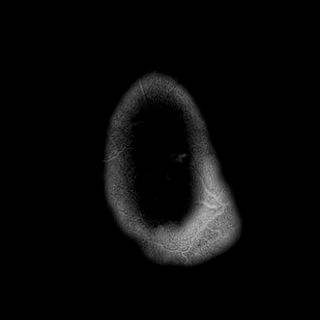

[Series 11: FLAIR · axial · 5.0mm · 0.45mm/px · z∈[-107,+35]mm · 2 of 25 slices shown]
[im 1/25]
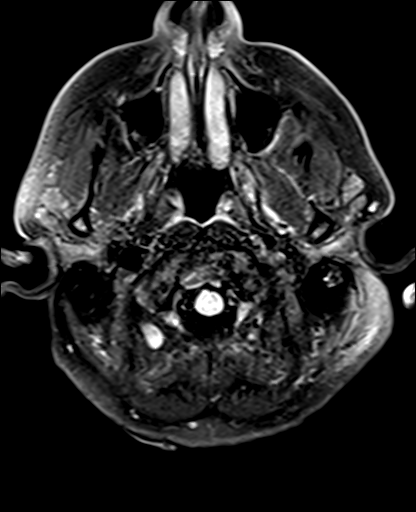
[im 25/25]
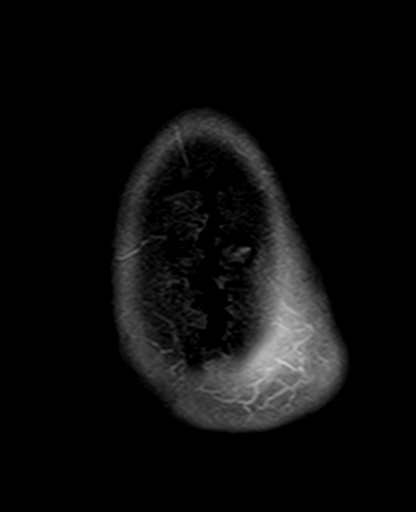

[Series 13: pha_images · axial · 3.0mm · 0.90mm/px · z∈[-121,+48]mm · 5 of 58 slices shown]
[im 1/58]
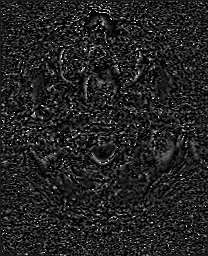
[im 15/58]
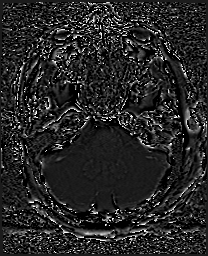
[im 29/58]
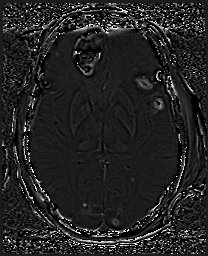
[im 43/58]
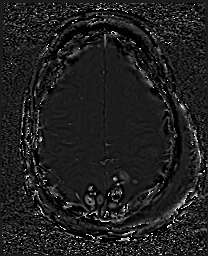
[im 58/58]
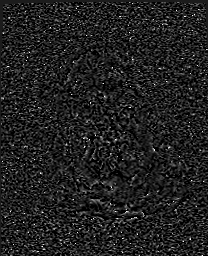

[Series 14: swi_images · axial · 3.0mm · 0.90mm/px · z∈[-124,+51]mm · 5 of 60 slices shown]
[im 1/60]
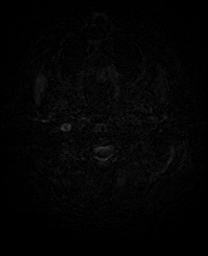
[im 15/60]
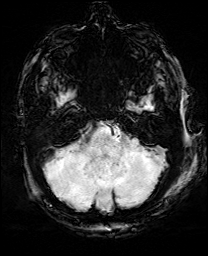
[im 30/60]
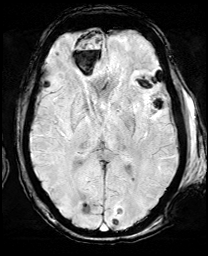
[im 45/60]
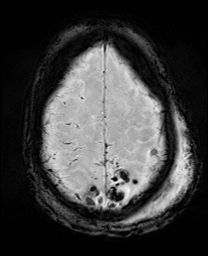
[im 60/60]
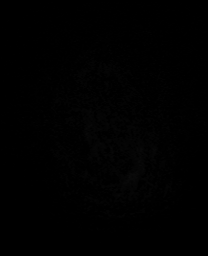

[Series 17: T2 · coronal · 5.0mm · 0.34mm/px · 3 of 29 slices shown (2 of 2)]
[im 1/29]
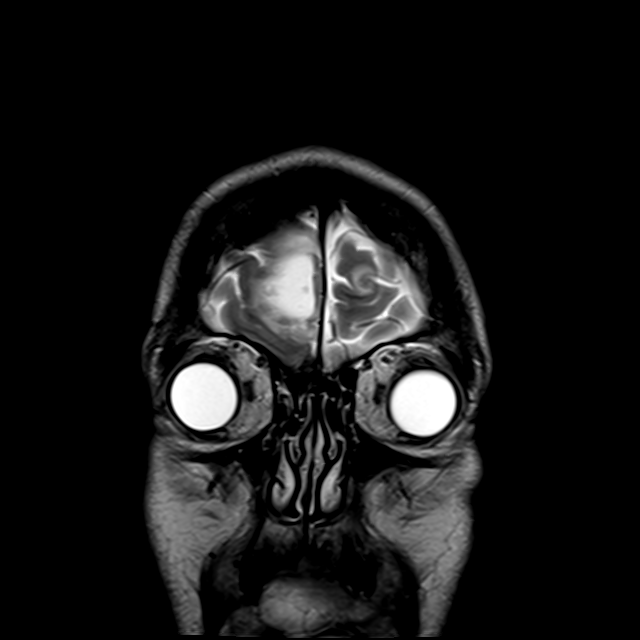
[im 15/29]
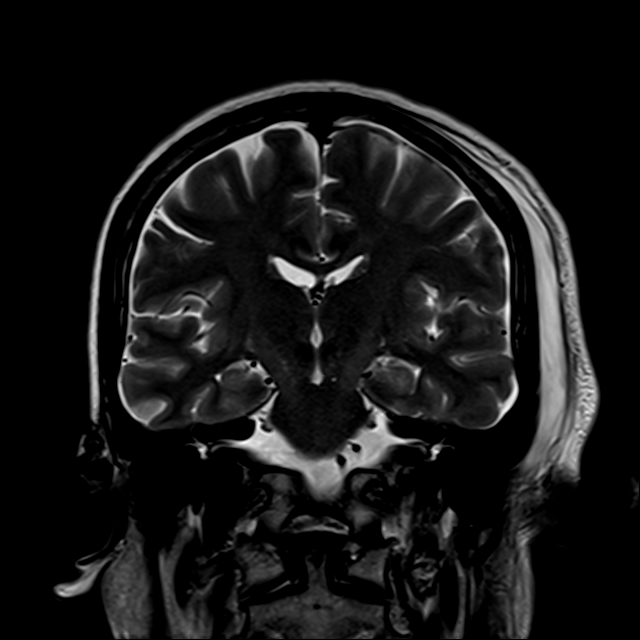
[im 29/29]
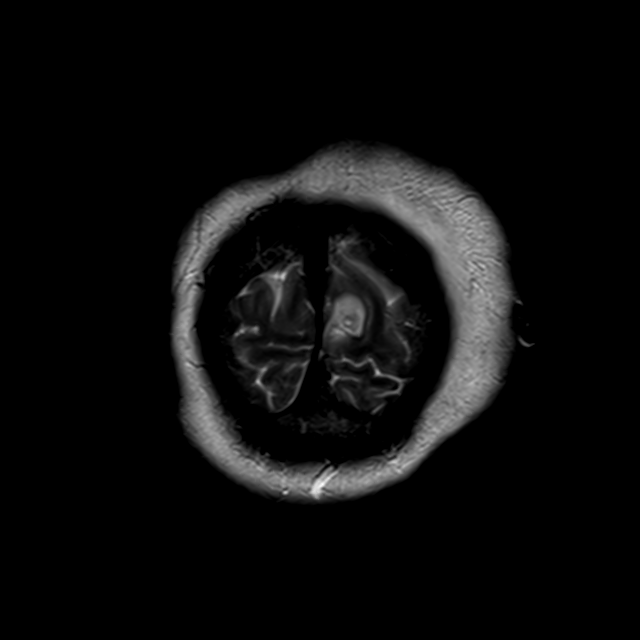

[43 of 48 positions shown; findings below may reference images not displayed]

FINDINGS: Brain: Numerous intraparenchymal hemorrhages as demonstrated on the
earlier head CT. The largest are located in the right frontal lobe
and left temporal lobe. There is no midline shift. There is
hyperintense T1-weighted signal of the globi pallidi. Moderate edema
surrounding the hemorrhagic sites. No hydrocephalus. There is a
hematocrit level within the right frontal pole emeritus.
Susceptibility weighted imaging shows multiple hemorrhages better
too small to be detected by CT. Normal midline structures.

Vascular: Normal flow voids.

Skull and upper cervical spine: Large left parietal scalp subgaleal
hematoma.

Sinuses/Orbits: Normal

Other: None
IMPRESSION: 1. Numerous intraparenchymal hemorrhages as demonstrated on the
earlier head CT, in addition to multiple microhemorrhages too small
to be detected by CT.
2. Hyperintense T1-weighted signal of the globi pallidi is
compatible with manganese deposition, usually as a sequela of
alcohol abuse.
3. No midline shift or hydrocephalus.
4. Large left parietal scalp subgaleal hematoma.

## 2020-06-21 IMAGING — MR MR CERVICAL SPINE W/O CM
5 series · 39 of 48 positions shown · non-contrast
Comparison: None.

CLINICAL DATA: Trauma.  Seizure.

EXAM:
MRI CERVICAL SPINE WITHOUT CONTRAST
TECHNIQUE: Multiplanar, multisequence MR imaging of the cervical spine was
performed. No intravenous contrast was administered.

[Series 5: T2 · sagittal · 3.0mm · 0.69mm/px · 6 of 15 slices shown (1 of 2)]
[im 1/15]
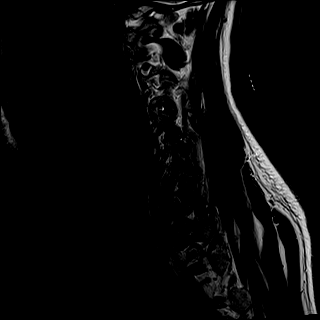
[im 3/15]
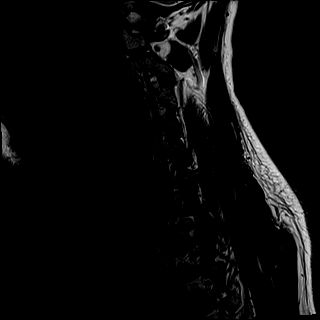
[im 6/15]
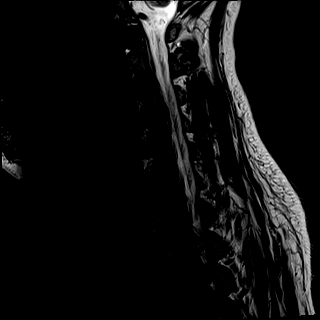
[im 9/15]
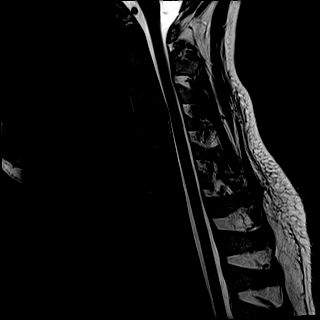
[im 12/15]
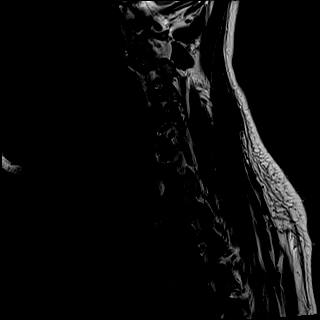
[im 15/15]
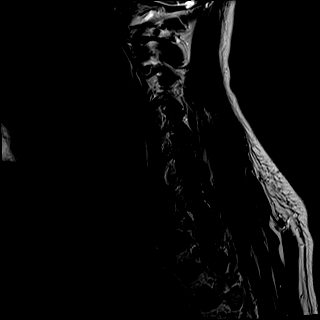

[Series 6: T1 · sagittal · 3.0mm · 0.69mm/px · 6 of 15 slices shown]
[im 1/15]
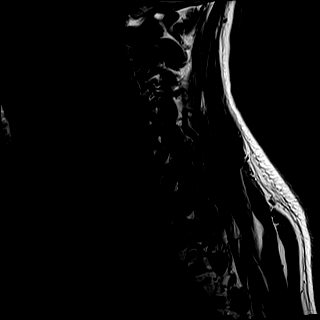
[im 3/15]
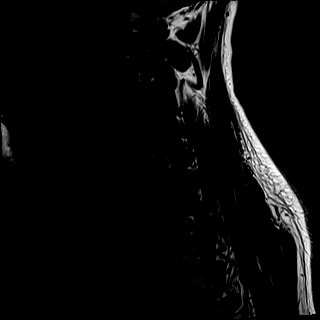
[im 6/15]
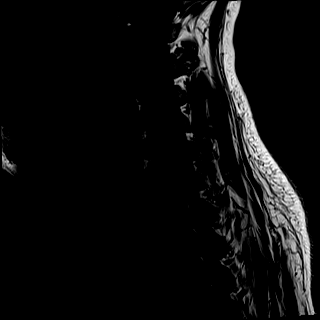
[im 9/15]
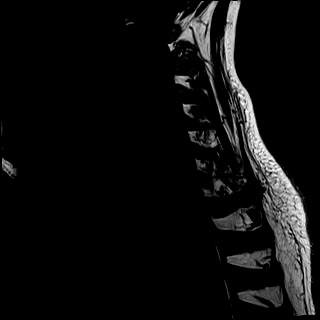
[im 12/15]
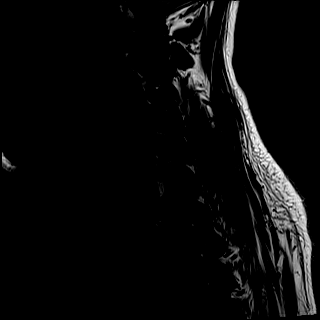
[im 15/15]
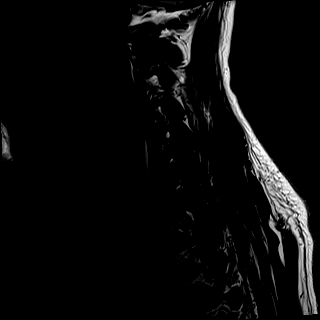

[Series 7: STIR · sagittal · 3.0mm · 0.86mm/px · 6 of 15 slices shown]
[im 1/15]
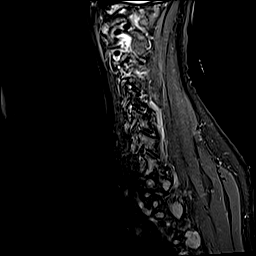
[im 3/15]
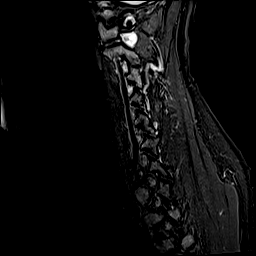
[im 6/15]
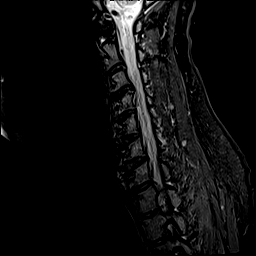
[im 9/15]
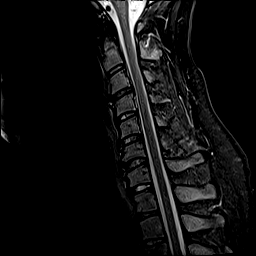
[im 12/15]
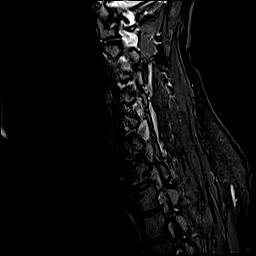
[im 15/15]
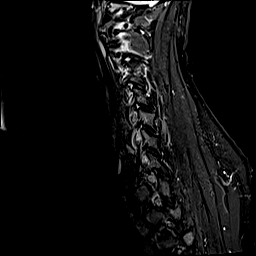

[Series 8: T2 · axial · 3.3mm · 0.66mm/px · z∈[-152,-12]mm · 13 of 40 slices shown (2 of 2)]
[im 1/40]
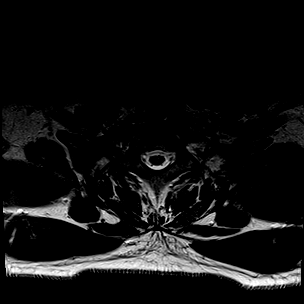
[im 3/40]
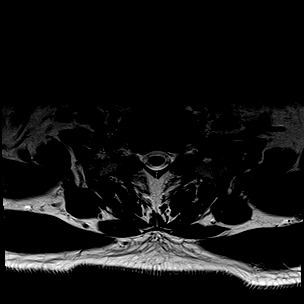
[im 6/40]
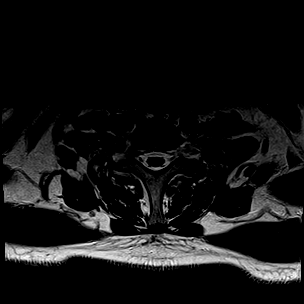
[im 9/40]
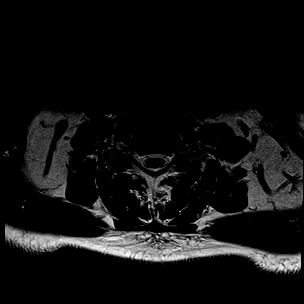
[im 12/40]
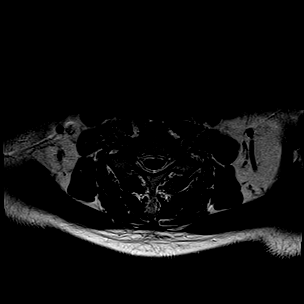
[im 14/40]
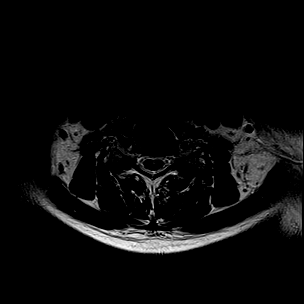
[im 17/40]
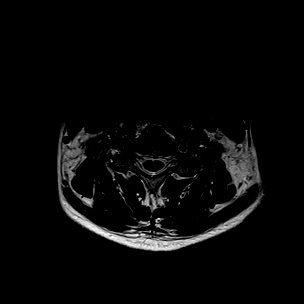
[im 20/40]
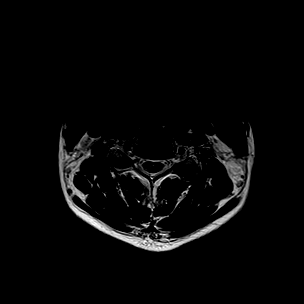
[im 23/40]
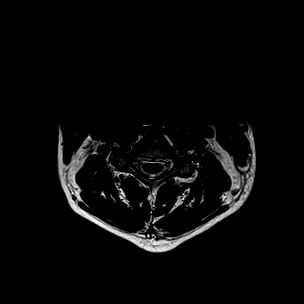
[im 26/40]
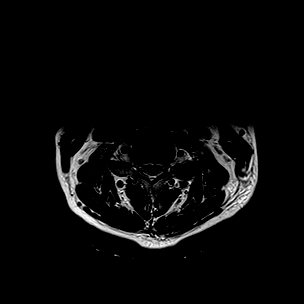
[im 28/40]
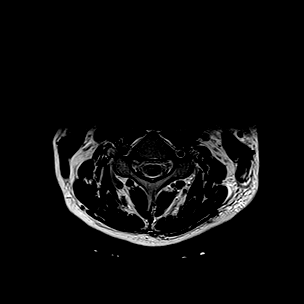
[im 34/40]
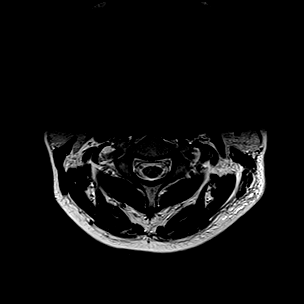
[im 40/40]
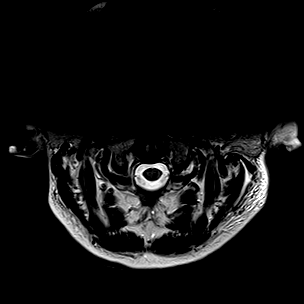

[Series 9: GRE · axial · 3.5mm · 0.39mm/px · z∈[-157,-7]mm · 8 of 40 slices shown]
[im 1/40]
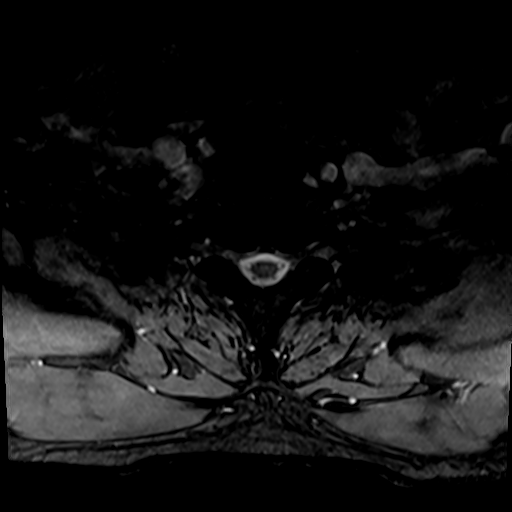
[im 6/40]
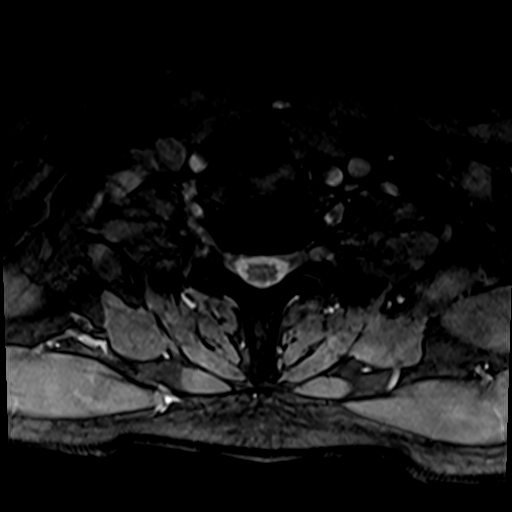
[im 12/40]
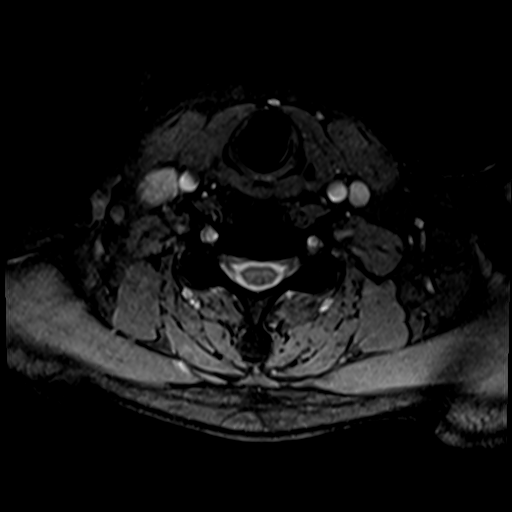
[im 17/40]
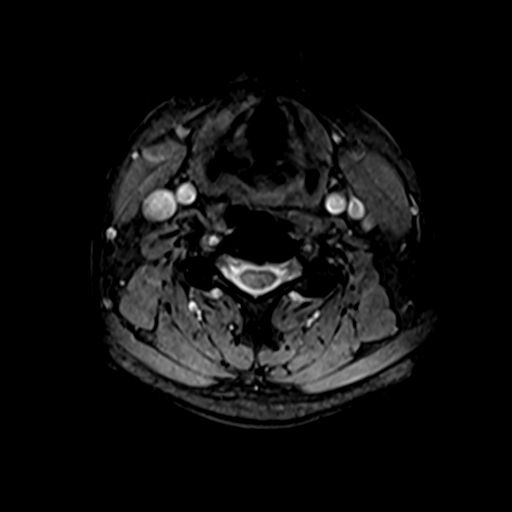
[im 23/40]
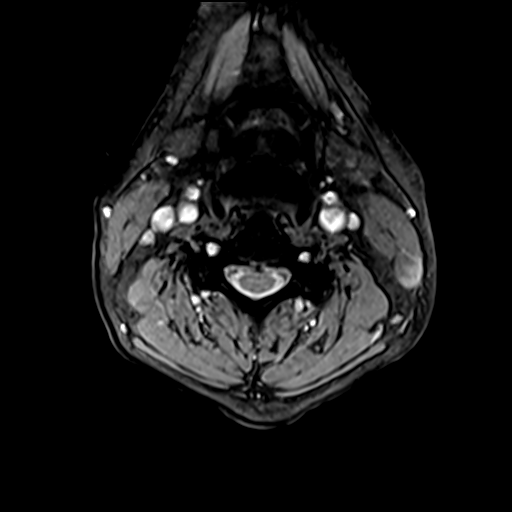
[im 28/40]
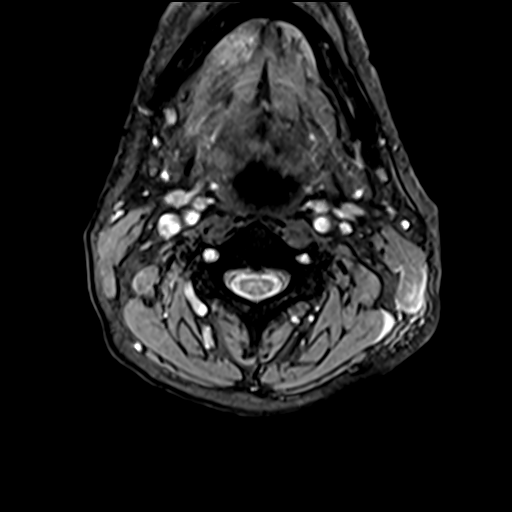
[im 34/40]
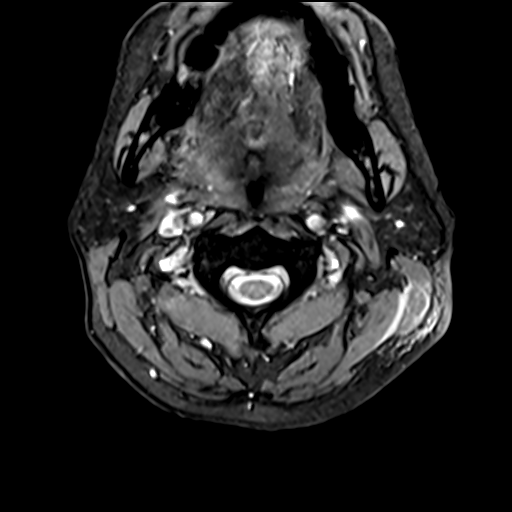
[im 40/40]
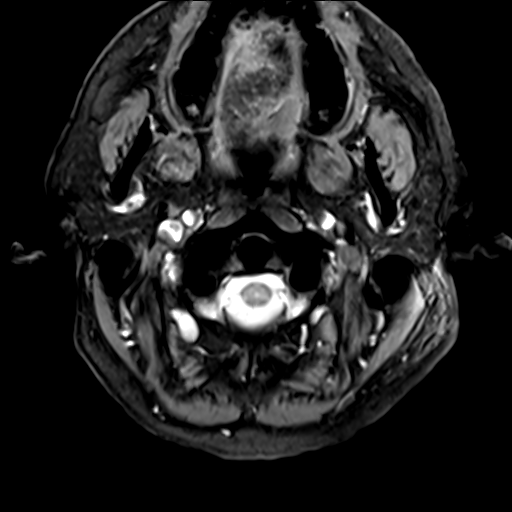

[39 of 48 positions shown; findings below may reference images not displayed]

FINDINGS: Alignment: Straightening of normal cervical lordosis.

Vertebrae: No fracture or acute signal abnormality.

Cord: Normal

Posterior Fossa, vertebral arteries, paraspinal tissues: Negative.

Disc levels:

C2-3: Normal.

C3-4: Bilateral uncovertebral hypertrophy with mild bilateral
foraminal stenosis. No spinal canal stenosis.

C4-5: Normal.

C5-6: Normal.

C6-7: Normal.

C7-T1: Normal.
IMPRESSION: 1. No acute abnormality of the cervical spine.
2. Mild bilateral C3-4 neural foraminal stenosis due to
uncovertebral hypertrophy.

## 2020-06-21 IMAGING — DX DG CHEST 1V PORT
1 series · 1 of 1 positions shown · non-contrast
Comparison: Portable exam [Q8] hours without priors for comparison

CLINICAL DATA: Acute respiratory failure with hypoxia

EXAM:
PORTABLE CHEST 1 VIEW

[chest]
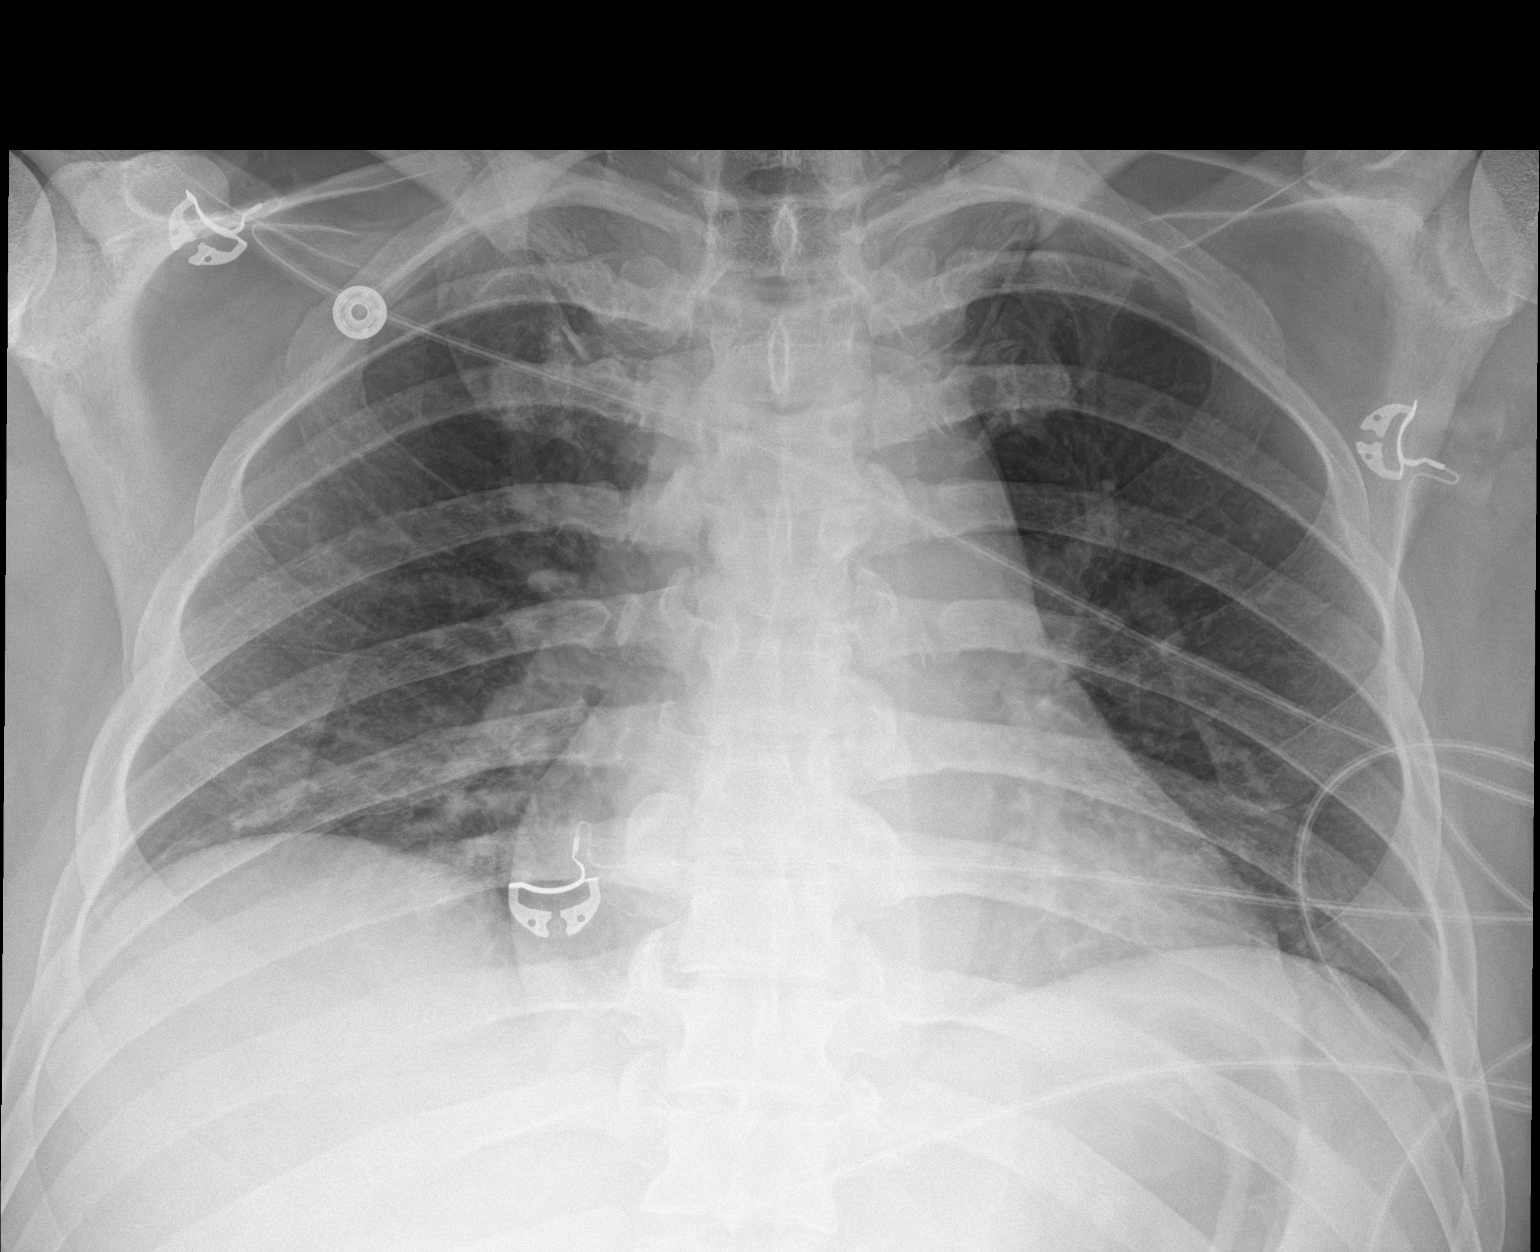

[1 of 1 positions shown; findings below may reference images not displayed]

FINDINGS: Upper normal heart size.

Mediastinal contours and pulmonary vascularity normal.

Decreased lung volumes with mild RIGHT basilar atelectasis.

No infiltrate, pleural effusion or pneumothorax.

Osseous structures unremarkable.
IMPRESSION: Decreased lung volumes with RIGHT basilar atelectasis.

## 2020-06-21 MED ORDER — SODIUM CHLORIDE 0.9% IV SOLUTION: Freq: Once | INTRAVENOUS | Status: DC

## 2020-06-21 MED ORDER — PREDNISOLONE 5 MG PO TABS
40.0000 mg | ORAL_TABLET | Freq: Every day | ORAL | Status: DC
  Administered 2020-06-21 – 2020-06-22 (×2): 40 mg via ORAL
  Filled 2020-06-21 (×3): qty 8

## 2020-06-21 MED ORDER — THIAMINE HCL 100 MG/ML IJ SOLN
250.0000 mg | Freq: Every day | INTRAVENOUS | Status: DC
  Administered 2020-06-23 – 2020-06-27 (×5): 250 mg via INTRAVENOUS
  Filled 2020-06-21 (×5): qty 2.5

## 2020-06-21 MED ORDER — OCTREOTIDE LOAD VIA INFUSION
50.0000 ug | Freq: Once | INTRAVENOUS | Status: AC
  Administered 2020-06-21: 50 ug via INTRAVENOUS
  Filled 2020-06-21: qty 25

## 2020-06-21 MED ORDER — SODIUM CHLORIDE 0.9 % IV SOLN
1.0000 g | INTRAVENOUS | Status: AC
  Administered 2020-06-21 – 2020-06-25 (×5): 1 g via INTRAVENOUS
  Filled 2020-06-21 (×5): qty 1

## 2020-06-21 MED ORDER — SODIUM CHLORIDE 0.9 % IV SOLN
INTRAVENOUS | Status: DC | PRN
  Administered 2020-06-21: 500 mL via INTRAVENOUS

## 2020-06-21 MED ORDER — SODIUM CHLORIDE 0.9 % IV SOLN
50.0000 ug/h | INTRAVENOUS | Status: DC
  Administered 2020-06-21 (×2): 50 ug/h via INTRAVENOUS
  Filled 2020-06-21 (×4): qty 1

## 2020-06-21 MED ORDER — VITAMIN K1 10 MG/ML IJ SOLN
10.0000 mg | Freq: Once | INTRAVENOUS | Status: AC
  Administered 2020-06-21: 10 mg via INTRAVENOUS
  Filled 2020-06-21: qty 1

## 2020-06-21 MED ORDER — LACTULOSE 10 GM/15ML PO SOLN
20.0000 g | Freq: Two times a day (BID) | ORAL | Status: DC
  Administered 2020-06-21 (×2): 20 g via ORAL
  Filled 2020-06-21 (×2): qty 30

## 2020-06-21 MED ORDER — DEXTROSE 50 % IV SOLN
INTRAVENOUS | Status: AC
  Administered 2020-06-21: 25 mL
  Filled 2020-06-21: qty 50

## 2020-06-21 MED ORDER — THIAMINE HCL 100 MG/ML IJ SOLN: 100.0000 mg | Freq: Every day | INTRAMUSCULAR | Status: DC

## 2020-06-21 MED ORDER — PANTOPRAZOLE SODIUM 40 MG IV SOLR
40.0000 mg | Freq: Two times a day (BID) | INTRAVENOUS | Status: DC
  Administered 2020-06-21 – 2020-06-22 (×3): 40 mg via INTRAVENOUS
  Filled 2020-06-21 (×3): qty 40

## 2020-06-21 MED ORDER — THIAMINE HCL 100 MG/ML IJ SOLN
500.0000 mg | Freq: Three times a day (TID) | INTRAVENOUS | Status: AC
  Administered 2020-06-21 – 2020-06-23 (×6): 500 mg via INTRAVENOUS
  Filled 2020-06-21 (×7): qty 5

## 2020-06-21 NOTE — Progress Notes (Signed)
NAME:  Matthew Horn, MRN:  [id number], DOB:  Mar 21, 1981, LOS: 1 ADMISSION DATE:  06/20/2020, CONSULTATION DATE: 830 10/2019 REFERRING MD: [facility] ED, CHIEF COMPLAINT: ICH  HPI/course in hospital  39 year old man with significant alcohol history.  Witnessed generalized tonic-clonic seizure with loss of consciousness while exiting restaurant today.  Brought here by EMS.  3 seizures en route which terminated with Versed 5 mg  Found to have a large superficial skull laceration on the right occiput which was stapled.  CT scan shows 2 small areas of intraparenchymal hemorrhage.  Patient remains confused and is not able to provide reliable corroborating history.  He apparently is from out of town.   Per brother  Past Medical History  No past medical history on file.  Patient is not able to provide reliable history.  Review of Systems:   Unable to obtain due to AMS  Social History    Unknown Family History   His family history is not on file.   Allergies Not on File   Home Medications  Prior to Admission medications   Not on File     Interim history/subjective:   Patient is still lethargic, responds to painful stimuli. No acute changes in mental status overnight.   Brother at bedside able to provide collateral history. States the patient is chronically ill and does not follow up for medical care. He was drinking heavily up until his admission. Has been having chronic GI bleed.  Objective   Blood pressure 108/69, pulse 71, temperature 98 F (36.7 C), temperature source Axillary, resp. rate 16, weight 108.4 kg, SpO2 100 %.        Intake/Output Summary (Last 24 hours) at 06/21/2020 1033 Last data filed at 06/21/2020 0934 Gross per 24 hour  Intake 1150.14 ml  Output 1550 ml  Net -399.86 ml   Filed Weights   06/20/20 1400  Weight: 108.4 kg    Examination: General: poorly groomed, chronically ill appearing HEENT: Scleral icterus present, cervical collar in place CV:  RRR Lungs: CTAB Abd: soft, no stigmata of chronic liver disease Skin: Jaundice present Neuro: confused, sedated, moving all limbs with painful stimuli   Ancillary tests (personally reviewed)  CBC: Recent Labs  Lab 06/20/20 1343 06/21/20 0100  WBC 11.8* 2.3*  NEUTROABS 5.2 1.4*  HGB 8.6* 4.5*  HCT 30.5* 14.5*  MCV 84.7 76.3*  PLT 41* 35*    Basic Metabolic Panel: Recent Labs  Lab 06/20/20 1343 06/21/20 0100  NA 135 135  K 3.6 3.5  CL 101 103  CO2 9* 21*  GLUCOSE 105* 100*  BUN 6 <5*  CREATININE 1.14 0.77  CALCIUM 8.0* 7.1*  MG  --  1.6*  PHOS  --  3.6   GFR: CrCl cannot be calculated (Unknown ideal weight.). Recent Labs  Lab 06/20/20 1343 06/21/20 0100  WBC 11.8* 2.3*    Liver Function Tests: Recent Labs  Lab 06/20/20 1343 06/21/20 0100  AST 156* 104*  102*  ALT 27 28  24   ALKPHOS 179* 119  120  BILITOT 6.2* 5.5*  5.2*  PROT 7.2 5.1*  5.0*  ALBUMIN 3.1* 2.3*  2.2*   No results for input(s): LIPASE, AMYLASE in the last 168 hours. No results for input(s): AMMONIA in the last 168 hours.  ABG No results found for: PHART, PCO2ART, PO2ART, HCO3, TCO2, ACIDBASEDEF, O2SAT   Coagulation Profile: Recent Labs  Lab 06/20/20 1432 06/21/20 0100  INR 1.7* 1.6*    Cardiac Enzymes: No results for input(s): CKTOTAL,  CKMB, CKMBINDEX, TROPONINI in the last 168 hours.  HbA1C: No results found for: HGBA1C  CBG: Recent Labs  Lab 06/20/20 1334 06/20/20 1919 06/20/20 2309 06/21/20 0311 06/21/20 0710  GLUCAP 97 105* 104* 99 87     Assessment & Plan:  Critically ill due to traumatic intraparenchymal hemorrhage following fall related to witnessed seizure. Mild confusion with no focal deficits. Mechanism and pattern of injury suggest patient at risk for further decompensation as cerebral contusions involved. ICH worsening on repeat CT. Concern for possible respiratory failure, may need to intubate, we will continue to monitor - Repeat head CT  this afternoon - Continue frequent neurological monitoring - Keep blood pressure systolic less than 160.  Acute Drop in Hg concerning for large active bleed ICH would not explain such a large drop in Hgb (8.6->4.5). No signs of retroperitoneal hemorrhage on Abdominal CT yesterday but significant varices noted. Hx chronic GI bleed per pt brother. Repeat CBC showed Hgb5.6 after 1u RBCs - Give 2u RBCs, s/p 1u - Repeat CT Abd   - GI consult  Recent seizure on background of possible known seizure disorder/alcohol use.   Unclear what his recent intake has been but remains at risk for alcohol withdrawal. No signs of seizure activity at this time. EEG on 9/1 showed no seizure activity.  -Seizure precautions -Keppra for seizure prevention  History of alcoholism at risk for withdrawal. - CIWA protocol. - Precedex stopped at this time but will leave on PRN in favor of Ativan at this time  Alcoholic Hepatitis with Liver Cirrhosis Jaundiced with thrombocytopenia. CT Abd suggestive of alcoholic cirrhosis. Maddreys score of 25.1, not a candidate for steroids. MELD-NA score of 21.  - 1u FFP given, 1 u RBCs given on 9/1 - Follow up on hepatitis panel  - Monitor coagulopathy  Leukopenia Acute drop of WBCs to 2.8 in the presence of alcohol use - Blood cultures x2 - Urine culture  Daily Goals Checklist  Pain/Anxiety/Delirium protocol (if indicated): CIWA only Neuro vitals: every 1 hours AED's: Keppra VAP protocol (if indicated): Not intubated Respiratory support goals: Titrate oxygen to sat greater than 92% Blood pressure target: Keep systolic blood pressure less than 160 DVT prophylaxis: SCDs only Nutrition Status: N.p.o. GI prophylaxis: Not indicated Fluid status goals: Allow autoregulation Urinary catheter: External catheter only Central lines: PIV Glucose control: Monitor Mobility/therapy needs: Bedrest, needs restraints to prevent self-harm Antibiotic de-escalation: No antibiotics Home  medication reconciliation: None known Daily labs: CBC, CMP daily; CBC q4h for next 20 hours Code Status: Full code Family Communication: Brother at bedside Disposition: ICU  Anette Riedel, MS4

## 2020-06-21 NOTE — Progress Notes (Signed)
NEUROLOGY CONSULTATIONPROGRESS NOTE   Date of service: June 21, 2020 Patient Name: Matthew Horn MRN:  [id number] DOB:  1981/09/21   Subjective  Somnolent, no seizures. ICH has worsened on repeat CTH. NSGY recommending repeat CTH in 12-24 hours.  Physical Exam   Neurologic Examination  Mental status/Cognition: Somnolent, opens eyes a little to loud voice. Dysarthric speech. Wiggles toes to command and does attempt to move arms to commands. Goes back to sleep. Still has jaundices skin. Pupils equal round and reactive to light.  Unable to assess coordination.  Labs   Lab Results  Component Value Date   NA 135 06/21/2020   K 3.5 06/21/2020   CL 103 06/21/2020   CO2 21 (L) 06/21/2020   GLUCOSE 100 (H) 06/21/2020   BUN <5 (L) 06/21/2020   CREATININE 0.77 06/21/2020   CALCIUM 7.1 (L) 06/21/2020   ALBUMIN 2.3 (L) 06/21/2020   ALBUMIN 2.2 (L) 06/21/2020   AST 104 (H) 06/21/2020   AST 102 (H) 06/21/2020   ALT 28 06/21/2020   ALT 24 06/21/2020   ALKPHOS 119 06/21/2020   ALKPHOS 120 06/21/2020   BILITOT 5.5 (H) 06/21/2020   BILITOT 5.2 (H) 06/21/2020   GFRNONAA >60 06/21/2020   GFRAA >60 06/21/2020     Imaging and Diagnostic studies  CTH without contrast: 1. Worsening multifocal intraparenchymal hemorrhage, in addition to numerous new hemorrhagic sites. 2. No midline shift or other mass effect.  MRI Brain without contrast: 1. Numerous intraparenchymal hemorrhages as demonstrated on the earlier head CT, in addition to multiple microhemorrhages too small to be detected by CT. 2. Hyperintense T1-weighted signal of the globi pallidi is compatible with manganese deposition, usually as a sequela of alcohol abuse. 3. No midline shift or hydrocephalus. 4. Large left parietal scalp subgaleal hematoma.   Impression   Matthew Horn is a 39 y.o. male with PMH significant for EtOH use, presenting with truamatic ICH and seizures with coagululopathy with elevated INR  and low platelet count. His neurologic examination is notable for encephalopathy but no focal deficit. He has not had anymore seizures on keppra. MRI Brain without contrast with numerous IPH and Manganeses desposits on T1 weighted images sequelae of EtOH use. Suspect that his seizures are 2/2 IPH but also considering withdrawal, EtOH use. Would recommend continuing Keppra 500mg  BID.  Recommendations  - continue Keppra 500mg  BID and seizure precautions - Ativan 2mg  IV as needed for seizure lasting more than 2 mins. - No driving for 6 months and patient has to be cleared by neurology before resuming driving. - I switched thiamine 100mg  daily tthiamine replacement wernicke's protocol with 500mg  IV TID x 6 doses, then 250mg  daily x 5 doses, then 100mg  IV or PO thiamine daily. - will defer management of traumatic ICH to NSGY and PCCM.  - CIWA protocol for EtOh withdrawal. - Neurology inpatient team will signoff. Please feel free to contact with any questions or concerns. ______________________________________________________________________   Thank you for the opportunity to take part in the care of this patient. If you have any further questions, please contact the neurology consultation attending.  Signed,  [facility] Pager Number 

## 2020-06-21 NOTE — Progress Notes (Addendum)
eLink Physician-Brief Progress Note Patient Name: Matthew Horn DOB: [birth year] MRN: [id number]   Date of Service  06/21/2020  HPI/Events of Note  Hemoglobin 4.5 gm %, no obvious bleeding.  eICU Interventions  Transfuse 3 units PRBC, transfuse 1 unit FFP. Start Pantoprazole bid, CT abdomen r/o retro peritoneal hematoma.         Thomasene Lot [name] 06/21/2020, 6:26 AM

## 2020-06-21 NOTE — Procedures (Signed)
Patient Name: Matthew Horn  MRN: [id number]  Epilepsy Attending: Charlsie Quest  Referring Physician/Provider: Dr. Thayer Headings Date: 06/20/1020 Duration: 25 minutes  Patient history: 39 year old male with history of alcohol use had generalized tonic-clonic seizure.  CT head on arrival showed multiple traumatic subarachnoid contusions.  EEG to evaluate for seizures.  Level of alertness: Comatose  AEDs during EEG study: Keppra  Technical aspects: This EEG study was done with scalp electrodes positioned according to the 10-20 International system of electrode placement. Electrical activity was acquired at a sampling rate of 500Hz  and reviewed with a high frequency filter of 70Hz  and a low frequency filter of 1Hz . EEG data were recorded continuously and digitally stored.   Description: EEG showed continuous generalized 2 to 3 Hz delta slowing with overriding 13 to 15 Hz generalized beta activity.  Hyperventilation and photic stimulation were not performed.     ABNORMALITY -Continuous slow, generalized -Excessive beta, generalized  IMPRESSION: This study is suggestive of severe diffuse encephalopathy, nonspecific etiology but likely related to sedation.  No seizures or epileptiform discharges were seen throughout the recording.   [name] 

## 2020-06-21 NOTE — Progress Notes (Signed)
NAME:  Kyden Potash, MRN:  [id number], DOB:  April 30, 1981, LOS: 1 ADMISSION DATE:  06/20/2020, CONSULTATION DATE: 830 10/2019 REFERRING MD: [facility] ED, CHIEF COMPLAINT: I[facility]  HPI/course in hospital  39 year old man with significant alcohol history.  Witnessed generalized tonic-clonic seizure with loss of consciousness while exiting restaurant today.  Brought here by EMS.  3 seizures en route which terminated with Versed 5 mg  Found to have a large superficial skull laceration on the right occiput which was stapled.  CT scan shows 2 small areas of intraparenchymal hemorrhage.  Patient remains confused and is not able to provide reliable corroborating history.  He apparently is from out of town.  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  Cirrhosis with a distended gallbladder without cholecystitis or cholelithiasis, nonspecific hepatic flexure colonic wall thickening   Interim history/subjective:   Progressive intraparenchymal hemorrhage as described above on CT head from earlier this morning Precedex 0.4 No seizure activity reported Persistent lethargy   Objective   Blood pressure 108/69, pulse 71, temperature 98 F (36.7 C), temperature source Axillary, resp. rate 16, weight 108.4 kg, SpO2 100 %.        Intake/Output Summary (Last 24 hours) at 06/21/2020 0951 Last data filed at 06/21/2020 0934 Gross per 24 hour  Intake 1150.14 ml  Output 1550 ml  Net -399.86 ml   Filed Weights   06/20/20 1400  Weight:  108.4 kg    Examination: Gen: Ill-appearing jaundiced man HEENT: Scleral icterus, c-collar in place, appears to have some pooling secretions that is not clearing effectively Lungs: Coarse bilaterally without wheezing or crackles CV: Regular, distant, no murmur Abd: Mildly distended, no fluid wave detected, hypoactive bowel sounds GU: Yellow urine Neuro: Hypersomnolent, garbled speech with stimulation, did not open eyes, did not follow commands   Ancillary tests (personally reviewed)  CBC: Recent Labs  Lab 06/20/20 1343 06/21/20 0100  WBC 11.8* 2.3*  NEUTROABS 5.2 1.4*  HGB 8.6* 4.5*  HCT 30.5* 14.5*  MCV 84.7 76.3*  PLT 41* 35*    Basic Metabolic Panel: Recent Labs  Lab 06/20/20 1343 06/21/20 0100  NA 135 135  K 3.6 3.5  CL 101 103  CO2 9* 21*  GLUCOSE 105* 100*  BUN 6 <5*  CREATININE 1.14 0.77  CALCIUM 8.0* 7.1*  MG  --  1.6*  PHOS  --  3.6   GFR: CrCl cannot be calculated (Unknown ideal weight.). Recent Labs  Lab 06/20/20 1343 06/21/20 0100  WBC 11.8* 2.3*    Liver Function Tests: Recent Labs  Lab 06/20/20 1343 06/21/20 0100  AST 156* 104*  102*  ALT 27 28  24   ALKPHOS 179* 119  120  BILITOT 6.2* 5.5*  5.2*  PROT 7.2 5.1*  5.0*  ALBUMIN 3.1* 2.3*  2.2*   No results for input(s): LIPASE, AMYLASE in the last 168 hours. No results for input(s): AMMONIA in the last 168 hours.  ABG No results found for: PHART, PCO2ART, PO2ART, HCO3, TCO2, ACIDBASEDEF, O2SAT   Coagulation Profile: Recent Labs  Lab 06/20/20 1432 06/21/20 0100  INR 1.7* 1.6*    Cardiac Enzymes:  No results for input(s): CKTOTAL, CKMB, CKMBINDEX, TROPONINI in the last 168 hours.  HbA1C: No results found for: HGBA1C  CBG: Recent Labs  Lab 06/20/20 1334 06/20/20 1919 06/20/20 2309 06/21/20 0311 06/21/20 0710  GLUCAP 97 105* 104* 99 87     Assessment & Plan:   Traumatic intraparenchymal SA hemorrhage following fall related to witnessed seizure. Progressive  lethargy over last 24 h. Also intermittent agitation on precedex 0.4 currently.  Serial neuro checks Tight blood pressure control, goal SBP 160 Repeat CT head planned for noon today 9/1.  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  Overnight EEG without any evidence of active seizures 9/1 Currently on Keppra Precedex available for any evidence of alcohol withdrawal  Evolving acute respiratory failure due to inadequate airway protection, upper airway control and secretions. Following closely for his ability to manage airway.  At high risk for intubation and ventilation  Severe acute anemia cause unknown without any overt evidence of blood loss currently.  At high risk for GI bleeding.  CT abdomen done 2200 last night without any evidence of retroperitoneal blood Recheck Hgb STAT as note concomitant drop in WBC Follow serial CBC and for evidence GI blood loss Will likely consult gastroenterology 9/1 for both his presumed GI blood loss and his cirrhosis Unclear if there is any role to repeat his CT abdomen at this time to evaluate for retroperitoneal bleeding since his most recent CT Send hemolysis labs PPI BID as ordered  Leukopenia, ? Possible evolving infection Recheck CBC now to confirm  Alcoholism, at risk for withdrawal Precedex available as above  Alcoholic cirrhosis, jaundice.  Probable superimposed alcoholic hepatitis Check ammonia 9/1 Full hepatitis panel is pending, hepatitis B surface antigen negative Follow LFT Hold off on corticosteroids at this time, discriminant function 23.7  Thrombocytopenia Follow CBC, platelets given 8/31  Hepatic coagulopathy Received FFP 8/31 in setting of his intracranial bleeding Follow serial INR Vitamin K given on 8/31, repeat on 9/1   Daily Goals  Checklist  Pain/Anxiety/Delirium protocol (if indicated): CIWA only, precedex Neuro vitals: every 1 hours AED's: Keppra VAP protocol (if indicated): Not intubated Respiratory support goals: Titrate oxygen to sat greater than 92% Blood pressure target: Keep systolic blood pressure less than 160 DVT prophylaxis: SCDs only Nutrition Status: N.p.o. GI prophylaxis: PPI BID Fluid status goals: Allow autoregulation Urinary catheter: External catheter only Central lines: PIV Glucose control: Monitor Mobility/therapy needs: Bedrest, needs restraints to prevent self-harm Antibiotic de-escalation: No antibiotics Home medication reconciliation: None known Code Status: Full code Family Communication: updated brother at bedside on 9/1 Disposition: ICU  CRITICAL CARE Performed by: Leslye Peer   Independent CC time 35 minutes   Levy Pupa, MD, PhD 06/21/2020, 10:37 AM [facility] [phone] or if no answer [phone]

## 2020-06-21 NOTE — Consult Note (Addendum)
[facility] Consult: 12:04 PM 06/21/2020  LOS: 1 day    Referring Provider: Dr Lamonte Sakai  Primary Care Physician:  None Primary Gastroenterologist: none.  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 male.  PMH substance abuse.  Address per Louis Matte is in [place]. Working in [place] in [employer].    [address] after leaving [employer] with his employer.  Seizure activity noted.  Sustained CHI w intraperenchymal hemorrhages, L scalp hematoma.  Notes mention EMS unable to control bleeding from his head at the scene. Severe, diffuse encephalopathy on EEG and  No further seizures on Keppra.   Medical staff noted obvious jaundice.  T bili 6.2, alk phos 179, AST/ALT 156/27. Hgb 8.6 >> 5.6.  MCV 76.  Platelets 35 K.  INR 1.7 >> vitamin K, FFP >> 1.6. Na 135.   Acute hepatitis serologies non-reactive.    Abdominal ultrasound: Absent portal vein flow may represent sequela of PV thrombosis.  Fatty liver.  Splenomegaly.  Gallbladder wall thickening without Cholelithiasis. 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  No PV, SMV, splenic vein thrombosis.  Cirrhosis.  Pericholecystic fluid in distended gallbladder likely due to cirrhosis.  Small volume ascites.  Nonspecific thickening of colon at hepatic flexure.  Thus far has received 2 packets of platelets, 2 packets FFP,  2 units PRBCs.  Follow-up CBC pending upon completion of second PRBC.    Has required propofol, fentanyl, soft restraints for agitation.  Currently sedated.     No past medical history on file.    Prior to Admission medications   Not on File    Scheduled Meds: . sodium chloride   Intravenous Once  . sodium  chloride   Intravenous Once  . sodium chloride   Intravenous Once  . Chlorhexidine Gluconate Cloth  6 each Topical Daily  . lactulose  20 g Oral BID  . levETIRAcetam  500 mg Oral BID  . pantoprazole (PROTONIX) IV  40 mg Intravenous Q12H  . thiamine injection  100 mg Intravenous Daily   Infusions: . sodium chloride    . dexmedetomidine (PRECEDEX) IV infusion 0.4 mcg/kg/hr (06/21/20 0600)  . lactated ringers    . levETIRAcetam 500 mg (06/21/20 0934)   PRN Meds: labetalol, midazolam   Allergies as of 06/20/2020  . (Not on File)    No family history on file.  Social History   Socioeconomic History  . Marital status: Unknown    Spouse name: Not on file  . Number of children: Not on file  . Years of education: Not on file  . Highest education level: Not on file  Occupational History  . Not on file  Tobacco Use  . Smoking status: Not on file  Substance and Sexual Activity  . Alcohol use: Not on file  . Drug use: Not on file  . Sexual activity: Not on file  Other Topics Concern  . Not on file  Social History Narrative  . Not  on file   Social Determinants of Health   Financial Resource Strain:   . Difficulty of Paying Living Expenses: Not on file  Food Insecurity:   . Worried About Charity fundraiser in the Last Year: Not on file  . Ran Out of Food in the Last Year: Not on file  Transportation Needs:   . Lack of Transportation (Medical): Not on file  . Lack of Transportation (Non-Medical): Not on file  Physical Activity:   . Days of Exercise per Week: Not on file  . Minutes of Exercise per Session: Not on file  Stress:   . Feeling of Stress : Not on file  Social Connections:   . Frequency of Communication with Friends and Family: Not on file  . Frequency of Social Gatherings with Friends and Family: Not on file  . Attends Religious Services: Not on file  . Active Member of Clubs or Organizations: Not on file  . Attends Archivist Meetings: Not on  file  . Marital Status: Not on file  Intimate Partner Violence:   . Fear of Current or Ex-Partner: Not on file  . Emotionally Abused: Not on file  . Physically Abused: Not on file  . Sexually Abused: Not on file    REVIEW OF SYSTEMS: Unable to complete as the patient is sedated   PHYSICAL EXAM: Vital signs in last 24 hours: Vitals:   06/21/20 1100 06/21/20 1145  BP: 106/62 106/65  Pulse: 66 65  Resp: 16 16  Temp:  98.4 F (36.9 C)  SpO2: 100% 100%   Wt Readings from Last 3 Encounters:  06/20/20 108.4 kg    General: Jaundice, sedated, not agitated Head: No facial asymmetry or swelling. Did not lift his head to assess but no obvious bruising or fresh/dried blood. Eyes: Scleral icterus. No conjunctival pallor. Ears: Unable to assess hearing Nose: No discharge Mouth: Bruise on the lower lip. No fresh or old blood but unable to open the mouth. Neck: No JVD, no masses, no thyromegaly Lungs: Nonlabored breathing. Heart: RRR. No MRG. S1, S2 present. Abdomen: Soft, nontender, nondistended. No HSM, masses, bruits, hernias. Bowel sounds active..   Rectal: Not performed Musc/Skeltl: No joint redness, gross deformity, swelling Extremities: No CCE. Neurologic: Unresponsive to moderate palpation. Did not make visit gross attempts to wake patient up. Skin: No rash, no sores, no telangiectasia Tattoos: Visible tattoo on anterior left clavicular area Nodes: No cervical adenopathy Psych: Sedated, unresponsive to exam.  Intake/Output from previous day: 08/31 0701 - 09/01 0700 In: 1000.1 [I.V.:350.9; Blood:506; IV Piggyback:143.2] Out: 750 [Urine:750] Intake/Output this shift: Total I/O In: 150 [Blood:150] Out: 800 [Urine:800]  LAB RESULTS: Recent Labs    06/20/20 1343 06/21/20 0100 06/21/20 1046  WBC 11.8* 2.3* 2.8*  HGB 8.6* 4.5* 5.6*  HCT 30.5* 14.5* 18.2*  PLT 41* 35* 37*   BMET Lab Results  Component Value Date   NA 135 06/21/2020   NA 135 06/20/2020   K 3.5  06/21/2020   K 3.6 06/20/2020   CL 103 06/21/2020   CL 101 06/20/2020   CO2 21 (L) 06/21/2020   CO2 9 (L) 06/20/2020   GLUCOSE 100 (H) 06/21/2020   GLUCOSE 105 (H) 06/20/2020   BUN <5 (L) 06/21/2020   BUN 6 06/20/2020   CREATININE 0.77 06/21/2020   CREATININE 1.14 06/20/2020   CALCIUM 7.1 (L) 06/21/2020   CALCIUM 8.0 (L) 06/20/2020   LFT Recent Labs    06/20/20 1343 06/21/20 0100  PROT 7.2  5.1*  5.0*  ALBUMIN 3.1* 2.3*  2.2*  AST 156* 104*  102*  ALT _0 ALKPHOS 179* 119  120  BILITOT 6.2* 5.5*  5.2*  BILIDIR  --  2.6*  IBILI  --  2.9*   PT/INR Lab Results  Component Value Date   INR 1.6 (H) 06/21/2020   INR 1.7 (H) 06/20/2020   Hepatitis Panel Recent Labs    06/21/20 0100  HEPBSAG NON REACTIVE   C-Diff No components found for: CDIFF Lipase  No results found for: LIPASE  Drugs of Abuse     Component Value Date/Time   LABOPIA NONE DETECTED 06/20/2020 1343   COCAINSCRNUR NONE DETECTED 06/20/2020 1343   LABBENZ POSITIVE (A) 06/20/2020 1343   AMPHETMU NONE DETECTED 06/20/2020 1343   THCU NONE DETECTED 06/20/2020 1343   LABBARB NONE DETECTED 06/20/2020 1343     RADIOLOGY STUDIES: CT HEAD WO CONTRAST  Result Date: 06/21/2020 CLINICAL DATA:  Hemorrhage follow-up EXAM: CT HEAD WITHOUT CONTRAST TECHNIQUE: Contiguous axial images were obtained from the base of the skull through the vertex without intravenous contrast. COMPARISON:  06/20/2020 FINDINGS: Brain: Marked worsening of multifocal intraparenchymal hemorrhage, most evident in the left temporal lobe, left frontal operculum and right occipital lobe. There are numerous new hemorrhagic foci within the occipital lobes and posterior parietal lobes. There is no midline shift or other mass effect. Vascular: No hyperdense vessel or unexpected calcification. Skull: Large amount of left parietal scalp swelling Sinuses/Orbits: No acute finding. Other: None. IMPRESSION: 1. Worsening multifocal intraparenchymal  hemorrhage, in addition to numerous new hemorrhagic sites. 2. No midline shift or other mass effect. These results were communicated to Dr. Roland Rack at 12:49 am on 06/21/2020 by text page via the [facility] messaging system. Electronically Signed   By: Ulyses Jarred M.D.   On: 06/21/2020 00:51   CT Head Wo Contrast  Result Date: 06/20/2020 CLINICAL DATA:  Seizure and head/cervical spine injury. EXAM: CT HEAD WITHOUT CONTRAST CT CERVICAL SPINE WITHOUT CONTRAST TECHNIQUE: Multidetector CT imaging of the head and cervical spine was performed following the standard protocol without intravenous contrast. Multiplanar CT image reconstructions of the cervical spine were also generated. COMPARISON:  None. FINDINGS: CT HEAD FINDINGS Brain: Moderate volume acute intraparenchymal hemorrhage in the anterior aspect of the right frontal lobe measures 2.8 x 2.8 (series 6, image 19) x 3.7 cm (series 4, image 17). This exerts local mass effect, however there is no significant midline shift. Additional small volume acute intraparenchymal hemorrhage is seen in the anterior left temporal lobe and superomedial aspect of the right occipital lobe, both with small volume associated subarachnoid hemorrhage. There is at least one focus of subarachnoid hemorrhage overlying the left occipital/parietal lobe (series 5, image 37). The ventricles are normal size, shape and morphology. Basilar cisterns are patent. Vascular: No hyperdense vessel or unexpected calcification. Skull: Normal. Negative for fracture or focal lesion. Sinuses/Orbits: No acute finding. Other: There is a large scalp hematoma overlying the left parietal bone. CT CERVICAL SPINE FINDINGS Alignment: Normal. Skull base and vertebrae: Motion artifact limits the evaluation for subtle fracture, however given this limitation no acute osseous injury is identified. No primary bone lesion or focal pathologic process. Soft tissues and spinal canal: No prevertebral fluid or  swelling. No visible canal hematoma. Disc levels:  Multilevel degenerative disc disease. Upper chest: Negative. Other: None. IMPRESSION: 1. Moderate volume acute intraparenchymal hemorrhage in the anterior aspect of the right frontal lobe exerting local mass effect, however there is  no significant midline shift. 2. Additional small volume acute intraparenchymal hemorrhage in the anterior left temporal lobe and superomedial aspect of the right occipital lobe, both with small volume associated subarachnoid hemorrhage. 3. Motion artifact limits the evaluation for subtle fracture in the cervical spine, however given this limitation no acute osseous injury is identified. These results were called by telephone at the time of interpretation on 06/20/2020 at 3:09 pm to provider [facility] , who verbally acknowledged these results. Electronically Signed   By: Zerita Boers M.D.   On: 06/20/2020 15:10   CT Cervical Spine Wo Contrast  Result Date: 06/20/2020 CLINICAL DATA:  Seizure and head/cervical spine injury. EXAM: CT HEAD WITHOUT CONTRAST CT CERVICAL SPINE WITHOUT CONTRAST TECHNIQUE: Multidetector CT imaging of the head and cervical spine was performed following the standard protocol without intravenous contrast. Multiplanar CT image reconstructions of the cervical spine were also generated. COMPARISON:  None. FINDINGS: CT HEAD FINDINGS Brain: Moderate volume acute intraparenchymal hemorrhage in the anterior aspect of the right frontal lobe measures 2.8 x 2.8 (series 6, image 19) x 3.7 cm (series 4, image 17). This exerts local mass effect, however there is no significant midline shift. Additional small volume acute intraparenchymal hemorrhage is seen in the anterior left temporal lobe and superomedial aspect of the right occipital lobe, both with small volume associated subarachnoid hemorrhage. There is at least one focus of subarachnoid hemorrhage overlying the left occipital/parietal lobe (series 5, image 37).  The ventricles are normal size, shape and morphology. Basilar cisterns are patent. Vascular: No hyperdense vessel or unexpected calcification. Skull: Normal. Negative for fracture or focal lesion. Sinuses/Orbits: No acute finding. Other: There is a large scalp hematoma overlying the left parietal bone. CT CERVICAL SPINE FINDINGS Alignment: Normal. Skull base and vertebrae: Motion artifact limits the evaluation for subtle fracture, however given this limitation no acute osseous injury is identified. No primary bone lesion or focal pathologic process. Soft tissues and spinal canal: No prevertebral fluid or swelling. No visible canal hematoma. Disc levels:  Multilevel degenerative disc disease. Upper chest: Negative. Other: None. IMPRESSION: 1. Moderate volume acute intraparenchymal hemorrhage in the anterior aspect of the right frontal lobe exerting local mass effect, however there is no significant midline shift. 2. Additional small volume acute intraparenchymal hemorrhage in the anterior left temporal lobe and superomedial aspect of the right occipital lobe, both with small volume associated subarachnoid hemorrhage. 3. Motion artifact limits the evaluation for subtle fracture in the cervical spine, however given this limitation no acute osseous injury is identified. These results were called by telephone at the time of interpretation on 06/20/2020 at 3:09 pm to provider [facility] , who verbally acknowledged these results. Electronically Signed   By: Zerita Boers M.D.   On: 06/20/2020 15:10   MR BRAIN WO CONTRAST  Result Date: 06/21/2020 CLINICAL DATA:  Seizure with head trauma EXAM: MRI HEAD WITHOUT CONTRAST TECHNIQUE: Multiplanar, multiecho pulse sequences of the brain and surrounding structures were obtained without intravenous contrast. COMPARISON:  Head CT 06/21/2020 FINDINGS: Brain: Numerous intraparenchymal hemorrhages as demonstrated on the earlier head CT. The largest are located in the right frontal  lobe and left temporal lobe. There is no midline shift. There is hyperintense T1-weighted signal of the globi pallidi. Moderate edema surrounding the hemorrhagic sites. No hydrocephalus. There is a hematocrit level within the right frontal pole emeritus. Susceptibility weighted imaging shows multiple hemorrhages better too small to be detected by CT. Normal midline structures. Vascular: Normal flow voids. Skull and upper  cervical spine: Large left parietal scalp subgaleal hematoma. Sinuses/Orbits: Normal Other: None IMPRESSION: 1. Numerous intraparenchymal hemorrhages as demonstrated on the earlier head CT, in addition to multiple microhemorrhages too small to be detected by CT. 2. Hyperintense T1-weighted signal of the globi pallidi is compatible with manganese deposition, usually as a sequela of alcohol abuse. 3. No midline shift or hydrocephalus. 4. Large left parietal scalp subgaleal hematoma. Electronically Signed   By: Ulyses Jarred M.D.   On: 06/21/2020 03:10   MR CERVICAL SPINE WO CONTRAST  Result Date: 06/21/2020 CLINICAL DATA:  Trauma.  Seizure. EXAM: MRI CERVICAL SPINE WITHOUT CONTRAST TECHNIQUE: Multiplanar, multisequence MR imaging of the cervical spine was performed. No intravenous contrast was administered. COMPARISON:  None. FINDINGS: Alignment: Straightening of normal cervical lordosis. Vertebrae: No fracture or acute signal abnormality. Cord: Normal Posterior Fossa, vertebral arteries, paraspinal tissues: Negative. Disc levels: C2-3: Normal. C3-4: Bilateral uncovertebral hypertrophy with mild bilateral foraminal stenosis. No spinal canal stenosis. C4-5: Normal. C5-6: Normal. C6-7: Normal. C7-T1: Normal. IMPRESSION: 1. No acute abnormality of the cervical spine. 2. Mild bilateral C3-4 neural foraminal stenosis due to uncovertebral hypertrophy. Electronically Signed   By: Ulyses Jarred M.D.   On: 06/21/2020 03:21   US Abdomen Complete  Result Date: 06/20/2020 CLINICAL DATA:  Cirrhosis. EXAM:  ABDOMEN ULTRASOUND COMPLETE COMPARISON:  None. FINDINGS: Gallbladder: No gallstones are identified. The gallbladder wall is markedly thickened (1.73 cm). The patient is unresponsive. As a result, the presence or absence of a sonographic Percell Miller sign could not be determined by the sonographer. Common bile duct: The common bile duct is not clearly visualized. Liver: No focal lesion identified. Diffusely increased echogenicity of the liver parenchyma is noted. No flow is seen within the portal vein on color Doppler imaging. IVC: Poorly visualized. Pancreas: Visualized portion unremarkable. Spleen: The spleen is enlarged (13.4 cm x 13.0 cm x 8.1 cm) with normal parenchymal echogenicity seen. Multiple varices are seen near the splenic hilum. Right Kidney: Length: 12.6 cm. Echogenicity within normal limits. No mass or hydronephrosis visualized. Left Kidney: Length: 12.6 cm. Echogenicity within normal limits. No mass or hydronephrosis visualized. Abdominal aorta: No aneurysm visualized. It should be noted that the distal aspect of the abdominal aorta and bifurcation are not clearly visualized. Other findings: A mild amount of ascites is noted. IMPRESSION: 1. Absent portal vein flow which may represent sequelae associated with portal venous thrombus. 2. Fatty liver. 3. Gallbladder wall thickening without evidence of cholelithiasis. 4. Splenomegaly. Electronically Signed   By: Virgina Norfolk M.D.   On: 06/20/2020 17:51   CT ABDOMEN PELVIS W CONTRAST  Result Date: 06/20/2020 CLINICAL DATA:  Cirrhosis, possible portal vein thrombosis, gallbladder wall thickening EXAM: CT ABDOMEN AND PELVIS WITH CONTRAST TECHNIQUE: Multidetector CT imaging of the abdomen and pelvis was performed using the standard protocol following bolus administration of intravenous contrast. CONTRAST:  192m OMNIPAQUE IOHEXOL 300 MG/ML  SOLN COMPARISON:  06/20/2020 FINDINGS: Lower chest: No acute pleural or parenchymal lung disease. Hepatobiliary:  Diffuse heterogeneity of the liver parenchyma, with nodularity of the liver capsule, compatible with known cirrhosis. No intrahepatic biliary duct dilation. Gallbladder is moderately distended, with pericholecystic fluid likely due to cirrhosis. No evidence of gallbladder wall thickening or calcified gallstones. Pancreas: Unremarkable. No pancreatic ductal dilatation or surrounding inflammatory changes. Spleen: Spleen is enlarged consistent with portal venous hypertension, measuring 16 cm in craniocaudal length. Adrenals/Urinary Tract: 3 mm nonobstructing calculus lower pole left kidney. Right kidney is unremarkable. The bladder is moderately distended with no filling defects.  The adrenals are normal. Stomach/Bowel: No bowel obstruction or ileus. There is nonspecific wall thickening within a segment of the ascending colon near the hepatic flexure. This could be due to under distension, inflammation/infection, or wall thickening due to adjacent right upper quadrant free fluid. There is a normal appendix. Vascular/Lymphatic: The aorta is unremarkable without atherosclerosis. The portal vein, splenic vein, and superior mesenteric vein opacify normally. No evidence of portal venous thrombosis. There is evidence of significant portal venous hypertension, with splenomegaly and marked splenic and gastric varices. No pathologic adenopathy within the abdomen or pelvis. Reproductive: Prostate is unremarkable. Other: Small volume ascites greatest in the right upper quadrant and lower pelvis. No free intraperitoneal gas. No abdominal wall hernia. Musculoskeletal: No acute or destructive bony lesions. Reconstructed images demonstrate no additional findings. IMPRESSION: 1. Normal opacification of the portal vein, SMV, and splenic vein. No evidence of portal vein thrombosis. 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 2. Cirrhosis.  No focal liver abnormality on this single  phase exam. 3. Distended gallbladder, with pericholecystic free fluid likely due to underlying cirrhosis. No evidence of cholecystitis or cholelithiasis. 4. Small volume ascites. 5. Nonspecific wall thickening at the hepatic flexure of the colon, which could be due to inflammatory/infectious colitis, under distension, or wall thickening due to adjacent free fluid in the right upper quadrant. Electronically Signed   By: Randa Ngo M.D.   On: 06/20/2020 21:53   DG Chest Port 1 View  Result Date: 06/21/2020 CLINICAL DATA:  Acute respiratory failure with hypoxia EXAM: PORTABLE CHEST 1 VIEW COMPARISON:  Portable exam 1110 hours without priors for comparison FINDINGS: Upper normal heart size. Mediastinal contours and pulmonary vascularity normal. Decreased lung volumes with mild RIGHT basilar atelectasis. No infiltrate, pleural effusion or pneumothorax. Osseous structures unremarkable. IMPRESSION: Decreased lung volumes with RIGHT basilar atelectasis. Electronically Signed   By: Lavonia Dana M.D.   On: 06/21/2020 11:39   EEG adult  Result Date: 06/21/2020 Lora Havens, MD     06/21/2020  8:58 AM Patient Name: Erice Ahles MRN: [id number] Epilepsy Attending: Lora Havens Referring Physician/Provider: Dr. Dyann Ruddle Date: 06/20/1020 Duration: 25 minutes Patient history: 39 year old male with history of alcohol use had generalized tonic-clonic seizure.  CT head on arrival showed multiple traumatic subarachnoid contusions.  EEG to evaluate for seizures. Level of alertness: Comatose AEDs during EEG study: Keppra Technical aspects: This EEG study was done with scalp electrodes positioned according to the 10-20 International system of electrode placement. Electrical activity was acquired at a sampling rate of _0  and reviewed with a high frequency filter of _1  and a low frequency filter of _2 . EEG data were recorded continuously and digitally stored. Description: EEG showed continuous generalized 2 to 3  Hz delta slowing with overriding 13 to 15 Hz generalized beta activity.  Hyperventilation and photic stimulation were not performed.   ABNORMALITY -Continuous slow, generalized -Excessive beta, generalized IMPRESSION: This study is suggestive of severe diffuse encephalopathy, nonspecific etiology but likely related to sedation.  No seizures or epileptiform discharges were seen throughout the recording. Priyanka Barbra Sarks     IMPRESSION:   *   Decompensated cirrhosis with thrombocytopenia, jaundice, coagulopathy, portal hypertension, splenic/gastric varices.   MELD is 20  Disc fx score: 33.   LFTs slightly better on recheck within 24 hours. Acute hepatitis serologies all non-reactive.   pndg labs include Hep B surf Ab, HCV quant, ammonia.  S/p PRBC, platelets and FFP, vitamin K.  Apparent history of substance abuse but no other history provided. Empiric lactulose in place.    *   Seizure, fall, CHI.    *   Encephalopathy.  Ammonia level pndg.    *    Normocytic anemia.  No GI bleed as of yet but does have the splenic/gastric varices noted on CT. From notes he had vigorous, hard to control bleeding from his scalp after yesterday's fall.   This is a likely source contributing to blood loss anemia Baseline anemia not known.      PLAN:     *   EGD.  8 AM w Dr Silverio Decamp.  Damaris Schooner w brother Merrily Pew (see below) and he can sign consent.    *   Continue Protonix.  ?  Add Octreotide, Rocephin, prednisolone?  *    Phone calls to his father and mother were either unanswered or went to voicemail. I did speak with his friend/employer Minda Ditto.    *   Continue Protonix 40 mg IV bid.  ? add prednisolone,   *   FOBT test ordered.     Azucena Freed  06/21/2020, 12:04 PM Phone [phone]  Addendum at 1 PM: Pt's brother Nivan Melendrez was in room as I left.  [phone]  [phone] is his phone number Merrily Pew says pt has mentioned vomiting and coughin up blood, headaches, back pain, blood in stool, easy  bruising, falls, lump at base of neck.  Hx heavy ETOH abuse, never would go to rehab or seek help.   Based on this I ordered octreotide gtt and rocephin.   Adding Prednisolone 40 mg po/daily.        Attending Physician Note   I have taken a history, examined the patient and reviewed the chart. I agree with the Advanced Practitioner's note, impression and recommendations.  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 No clinical evidence of cholecystitis * Severe anemia likely from scalp bleed however need to evaluate for UGI sources of blood loss, evaluate for varices, portal gastropathy, ulcer  Continue pantoprazole IV bid Start Rocephin Start prednisolone  Check ammonia  Trend CBC, CMP, PT/INR EGD tomorrow  Lucio Edward, MD [facility]

## 2020-06-21 NOTE — H&P (View-Only) (Signed)
[facility] Consult: 12:04 PM 06/21/2020  LOS: 1 day    Referring Provider: Dr Lamonte Sakai  Primary Care Physician:  None Primary Gastroenterologist: none.  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 male.  PMH substance abuse.  Address per Louis Matte is in [place]. Working in [place] in [employer].    [address] after leaving [employer] with his employer.  Seizure activity noted.  Sustained CHI w intraperenchymal hemorrhages, L scalp hematoma.  Notes mention EMS unable to control bleeding from his head at the scene. Severe, diffuse encephalopathy on EEG and  No further seizures on Keppra.   Medical staff noted obvious jaundice.  T bili 6.2, alk phos 179, AST/ALT 156/27. Hgb 8.6 >> 5.6.  MCV 76.  Platelets 35 K.  INR 1.7 >> vitamin K, FFP >> 1.6. Na 135.   Acute hepatitis serologies non-reactive.    Abdominal ultrasound: Absent portal vein flow may represent sequela of PV thrombosis.  Fatty liver.  Splenomegaly.  Gallbladder wall thickening without Cholelithiasis. 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  No PV, SMV, splenic vein thrombosis.  Cirrhosis.  Pericholecystic fluid in distended gallbladder likely due to cirrhosis.  Small volume ascites.  Nonspecific thickening of colon at hepatic flexure.  Thus far has received 2 packets of platelets, 2 packets FFP,  2 units PRBCs.  Follow-up CBC pending upon completion of second PRBC.    Has required propofol, fentanyl, soft restraints for agitation.  Currently sedated.     No past medical history on file.    Prior to Admission medications   Not on File    Scheduled Meds: . sodium chloride   Intravenous Once  . sodium  chloride   Intravenous Once  . sodium chloride   Intravenous Once  . Chlorhexidine Gluconate Cloth  6 each Topical Daily  . lactulose  20 g Oral BID  . levETIRAcetam  500 mg Oral BID  . pantoprazole (PROTONIX) IV  40 mg Intravenous Q12H  . thiamine injection  100 mg Intravenous Daily   Infusions: . sodium chloride    . dexmedetomidine (PRECEDEX) IV infusion 0.4 mcg/kg/hr (06/21/20 0600)  . lactated ringers    . levETIRAcetam 500 mg (06/21/20 0934)   PRN Meds: labetalol, midazolam   Allergies as of 06/20/2020  . (Not on File)    No family history on file.  Social History   Socioeconomic History  . Marital status: Unknown    Spouse name: Not on file  . Number of children: Not on file  . Years of education: Not on file  . Highest education level: Not on file  Occupational History  . Not on file  Tobacco Use  . Smoking status: Not on file  Substance and Sexual Activity  . Alcohol use: Not on file  . Drug use: Not on file  . Sexual activity: Not on file  Other Topics Concern  . Not on file  Social History Narrative  . Not  on file   Social Determinants of Health   Financial Resource Strain:   . Difficulty of Paying Living Expenses: Not on file  Food Insecurity:   . Worried About Charity fundraiser in the Last Year: Not on file  . Ran Out of Food in the Last Year: Not on file  Transportation Needs:   . Lack of Transportation (Medical): Not on file  . Lack of Transportation (Non-Medical): Not on file  Physical Activity:   . Days of Exercise per Week: Not on file  . Minutes of Exercise per Session: Not on file  Stress:   . Feeling of Stress : Not on file  Social Connections:   . Frequency of Communication with Friends and Family: Not on file  . Frequency of Social Gatherings with Friends and Family: Not on file  . Attends Religious Services: Not on file  . Active Member of Clubs or Organizations: Not on file  . Attends Archivist Meetings: Not on  file  . Marital Status: Not on file  Intimate Partner Violence:   . Fear of Current or Ex-Partner: Not on file  . Emotionally Abused: Not on file  . Physically Abused: Not on file  . Sexually Abused: Not on file    REVIEW OF SYSTEMS: Unable to complete as the patient is sedated   PHYSICAL EXAM: Vital signs in last 24 hours: Vitals:   06/21/20 1100 06/21/20 1145  BP: 106/62 106/65  Pulse: 66 65  Resp: 16 16  Temp:  98.4 F (36.9 C)  SpO2: 100% 100%   Wt Readings from Last 3 Encounters:  06/20/20 108.4 kg    General: Jaundice, sedated, not agitated Head: No facial asymmetry or swelling. Did not lift his head to assess but no obvious bruising or fresh/dried blood. Eyes: Scleral icterus. No conjunctival pallor. Ears: Unable to assess hearing Nose: No discharge Mouth: Bruise on the lower lip. No fresh or old blood but unable to open the mouth. Neck: No JVD, no masses, no thyromegaly Lungs: Nonlabored breathing. Heart: RRR. No MRG. S1, S2 present. Abdomen: Soft, nontender, nondistended. No HSM, masses, bruits, hernias. Bowel sounds active..   Rectal: Not performed Musc/Skeltl: No joint redness, gross deformity, swelling Extremities: No CCE. Neurologic: Unresponsive to moderate palpation. Did not make visit gross attempts to wake patient up. Skin: No rash, no sores, no telangiectasia Tattoos: Visible tattoo on anterior left clavicular area Nodes: No cervical adenopathy Psych: Sedated, unresponsive to exam.  Intake/Output from previous day: 08/31 0701 - 09/01 0700 In: 1000.1 [I.V.:350.9; Blood:506; IV Piggyback:143.2] Out: 750 [Urine:750] Intake/Output this shift: Total I/O In: 150 [Blood:150] Out: 800 [Urine:800]  LAB RESULTS: Recent Labs    06/20/20 1343 06/21/20 0100 06/21/20 1046  WBC 11.8* 2.3* 2.8*  HGB 8.6* 4.5* 5.6*  HCT 30.5* 14.5* 18.2*  PLT 41* 35* 37*   BMET Lab Results  Component Value Date   NA 135 06/21/2020   NA 135 06/20/2020   K 3.5  06/21/2020   K 3.6 06/20/2020   CL 103 06/21/2020   CL 101 06/20/2020   CO2 21 (L) 06/21/2020   CO2 9 (L) 06/20/2020   GLUCOSE 100 (H) 06/21/2020   GLUCOSE 105 (H) 06/20/2020   BUN <5 (L) 06/21/2020   BUN 6 06/20/2020   CREATININE 0.77 06/21/2020   CREATININE 1.14 06/20/2020   CALCIUM 7.1 (L) 06/21/2020   CALCIUM 8.0 (L) 06/20/2020   LFT Recent Labs    06/20/20 1343 06/21/20 0100  PROT 7.2  5.1*  5.0*  ALBUMIN 3.1* 2.3*  2.2*  AST 156* 104*  102*  ALT _0 ALKPHOS 179* 119  120  BILITOT 6.2* 5.5*  5.2*  BILIDIR  --  2.6*  IBILI  --  2.9*   PT/INR Lab Results  Component Value Date   INR 1.6 (H) 06/21/2020   INR 1.7 (H) 06/20/2020   Hepatitis Panel Recent Labs    06/21/20 0100  HEPBSAG NON REACTIVE   C-Diff No components found for: CDIFF Lipase  No results found for: LIPASE  Drugs of Abuse     Component Value Date/Time   LABOPIA NONE DETECTED 06/20/2020 1343   COCAINSCRNUR NONE DETECTED 06/20/2020 1343   LABBENZ POSITIVE (A) 06/20/2020 1343   AMPHETMU NONE DETECTED 06/20/2020 1343   THCU NONE DETECTED 06/20/2020 1343   LABBARB NONE DETECTED 06/20/2020 1343     RADIOLOGY STUDIES: CT HEAD WO CONTRAST  Result Date: 06/21/2020 CLINICAL DATA:  Hemorrhage follow-up EXAM: CT HEAD WITHOUT CONTRAST TECHNIQUE: Contiguous axial images were obtained from the base of the skull through the vertex without intravenous contrast. COMPARISON:  06/20/2020 FINDINGS: Brain: Marked worsening of multifocal intraparenchymal hemorrhage, most evident in the left temporal lobe, left frontal operculum and right occipital lobe. There are numerous new hemorrhagic foci within the occipital lobes and posterior parietal lobes. There is no midline shift or other mass effect. Vascular: No hyperdense vessel or unexpected calcification. Skull: Large amount of left parietal scalp swelling Sinuses/Orbits: No acute finding. Other: None. IMPRESSION: 1. Worsening multifocal intraparenchymal  hemorrhage, in addition to numerous new hemorrhagic sites. 2. No midline shift or other mass effect. These results were communicated to Dr. Roland Rack at 12:49 am on 06/21/2020 by text page via the [facility] messaging system. Electronically Signed   By: Ulyses Jarred M.D.   On: 06/21/2020 00:51   CT Head Wo Contrast  Result Date: 06/20/2020 CLINICAL DATA:  Seizure and head/cervical spine injury. EXAM: CT HEAD WITHOUT CONTRAST CT CERVICAL SPINE WITHOUT CONTRAST TECHNIQUE: Multidetector CT imaging of the head and cervical spine was performed following the standard protocol without intravenous contrast. Multiplanar CT image reconstructions of the cervical spine were also generated. COMPARISON:  None. FINDINGS: CT HEAD FINDINGS Brain: Moderate volume acute intraparenchymal hemorrhage in the anterior aspect of the right frontal lobe measures 2.8 x 2.8 (series 6, image 19) x 3.7 cm (series 4, image 17). This exerts local mass effect, however there is no significant midline shift. Additional small volume acute intraparenchymal hemorrhage is seen in the anterior left temporal lobe and superomedial aspect of the right occipital lobe, both with small volume associated subarachnoid hemorrhage. There is at least one focus of subarachnoid hemorrhage overlying the left occipital/parietal lobe (series 5, image 37). The ventricles are normal size, shape and morphology. Basilar cisterns are patent. Vascular: No hyperdense vessel or unexpected calcification. Skull: Normal. Negative for fracture or focal lesion. Sinuses/Orbits: No acute finding. Other: There is a large scalp hematoma overlying the left parietal bone. CT CERVICAL SPINE FINDINGS Alignment: Normal. Skull base and vertebrae: Motion artifact limits the evaluation for subtle fracture, however given this limitation no acute osseous injury is identified. No primary bone lesion or focal pathologic process. Soft tissues and spinal canal: No prevertebral fluid or  swelling. No visible canal hematoma. Disc levels:  Multilevel degenerative disc disease. Upper chest: Negative. Other: None. IMPRESSION: 1. Moderate volume acute intraparenchymal hemorrhage in the anterior aspect of the right frontal lobe exerting local mass effect, however there is  no significant midline shift. 2. Additional small volume acute intraparenchymal hemorrhage in the anterior left temporal lobe and superomedial aspect of the right occipital lobe, both with small volume associated subarachnoid hemorrhage. 3. Motion artifact limits the evaluation for subtle fracture in the cervical spine, however given this limitation no acute osseous injury is identified. These results were called by telephone at the time of interpretation on 06/20/2020 at 3:09 pm to provider [facility] , who verbally acknowledged these results. Electronically Signed   By: Zerita Boers M.D.   On: 06/20/2020 15:10   CT Cervical Spine Wo Contrast  Result Date: 06/20/2020 CLINICAL DATA:  Seizure and head/cervical spine injury. EXAM: CT HEAD WITHOUT CONTRAST CT CERVICAL SPINE WITHOUT CONTRAST TECHNIQUE: Multidetector CT imaging of the head and cervical spine was performed following the standard protocol without intravenous contrast. Multiplanar CT image reconstructions of the cervical spine were also generated. COMPARISON:  None. FINDINGS: CT HEAD FINDINGS Brain: Moderate volume acute intraparenchymal hemorrhage in the anterior aspect of the right frontal lobe measures 2.8 x 2.8 (series 6, image 19) x 3.7 cm (series 4, image 17). This exerts local mass effect, however there is no significant midline shift. Additional small volume acute intraparenchymal hemorrhage is seen in the anterior left temporal lobe and superomedial aspect of the right occipital lobe, both with small volume associated subarachnoid hemorrhage. There is at least one focus of subarachnoid hemorrhage overlying the left occipital/parietal lobe (series 5, image 37).  The ventricles are normal size, shape and morphology. Basilar cisterns are patent. Vascular: No hyperdense vessel or unexpected calcification. Skull: Normal. Negative for fracture or focal lesion. Sinuses/Orbits: No acute finding. Other: There is a large scalp hematoma overlying the left parietal bone. CT CERVICAL SPINE FINDINGS Alignment: Normal. Skull base and vertebrae: Motion artifact limits the evaluation for subtle fracture, however given this limitation no acute osseous injury is identified. No primary bone lesion or focal pathologic process. Soft tissues and spinal canal: No prevertebral fluid or swelling. No visible canal hematoma. Disc levels:  Multilevel degenerative disc disease. Upper chest: Negative. Other: None. IMPRESSION: 1. Moderate volume acute intraparenchymal hemorrhage in the anterior aspect of the right frontal lobe exerting local mass effect, however there is no significant midline shift. 2. Additional small volume acute intraparenchymal hemorrhage in the anterior left temporal lobe and superomedial aspect of the right occipital lobe, both with small volume associated subarachnoid hemorrhage. 3. Motion artifact limits the evaluation for subtle fracture in the cervical spine, however given this limitation no acute osseous injury is identified. These results were called by telephone at the time of interpretation on 06/20/2020 at 3:09 pm to provider [facility] , who verbally acknowledged these results. Electronically Signed   By: Zerita Boers M.D.   On: 06/20/2020 15:10   MR BRAIN WO CONTRAST  Result Date: 06/21/2020 CLINICAL DATA:  Seizure with head trauma EXAM: MRI HEAD WITHOUT CONTRAST TECHNIQUE: Multiplanar, multiecho pulse sequences of the brain and surrounding structures were obtained without intravenous contrast. COMPARISON:  Head CT 06/21/2020 FINDINGS: Brain: Numerous intraparenchymal hemorrhages as demonstrated on the earlier head CT. The largest are located in the right frontal  lobe and left temporal lobe. There is no midline shift. There is hyperintense T1-weighted signal of the globi pallidi. Moderate edema surrounding the hemorrhagic sites. No hydrocephalus. There is a hematocrit level within the right frontal pole emeritus. Susceptibility weighted imaging shows multiple hemorrhages better too small to be detected by CT. Normal midline structures. Vascular: Normal flow voids. Skull and upper  cervical spine: Large left parietal scalp subgaleal hematoma. Sinuses/Orbits: Normal Other: None IMPRESSION: 1. Numerous intraparenchymal hemorrhages as demonstrated on the earlier head CT, in addition to multiple microhemorrhages too small to be detected by CT. 2. Hyperintense T1-weighted signal of the globi pallidi is compatible with manganese deposition, usually as a sequela of alcohol abuse. 3. No midline shift or hydrocephalus. 4. Large left parietal scalp subgaleal hematoma. Electronically Signed   By: Ulyses Jarred M.D.   On: 06/21/2020 03:10   MR CERVICAL SPINE WO CONTRAST  Result Date: 06/21/2020 CLINICAL DATA:  Trauma.  Seizure. EXAM: MRI CERVICAL SPINE WITHOUT CONTRAST TECHNIQUE: Multiplanar, multisequence MR imaging of the cervical spine was performed. No intravenous contrast was administered. COMPARISON:  None. FINDINGS: Alignment: Straightening of normal cervical lordosis. Vertebrae: No fracture or acute signal abnormality. Cord: Normal Posterior Fossa, vertebral arteries, paraspinal tissues: Negative. Disc levels: C2-3: Normal. C3-4: Bilateral uncovertebral hypertrophy with mild bilateral foraminal stenosis. No spinal canal stenosis. C4-5: Normal. C5-6: Normal. C6-7: Normal. C7-T1: Normal. IMPRESSION: 1. No acute abnormality of the cervical spine. 2. Mild bilateral C3-4 neural foraminal stenosis due to uncovertebral hypertrophy. Electronically Signed   By: Ulyses Jarred M.D.   On: 06/21/2020 03:21   US Abdomen Complete  Result Date: 06/20/2020 CLINICAL DATA:  Cirrhosis. EXAM:  ABDOMEN ULTRASOUND COMPLETE COMPARISON:  None. FINDINGS: Gallbladder: No gallstones are identified. The gallbladder wall is markedly thickened (1.73 cm). The patient is unresponsive. As a result, the presence or absence of a sonographic Percell Miller sign could not be determined by the sonographer. Common bile duct: The common bile duct is not clearly visualized. Liver: No focal lesion identified. Diffusely increased echogenicity of the liver parenchyma is noted. No flow is seen within the portal vein on color Doppler imaging. IVC: Poorly visualized. Pancreas: Visualized portion unremarkable. Spleen: The spleen is enlarged (13.4 cm x 13.0 cm x 8.1 cm) with normal parenchymal echogenicity seen. Multiple varices are seen near the splenic hilum. Right Kidney: Length: 12.6 cm. Echogenicity within normal limits. No mass or hydronephrosis visualized. Left Kidney: Length: 12.6 cm. Echogenicity within normal limits. No mass or hydronephrosis visualized. Abdominal aorta: No aneurysm visualized. It should be noted that the distal aspect of the abdominal aorta and bifurcation are not clearly visualized. Other findings: A mild amount of ascites is noted. IMPRESSION: 1. Absent portal vein flow which may represent sequelae associated with portal venous thrombus. 2. Fatty liver. 3. Gallbladder wall thickening without evidence of cholelithiasis. 4. Splenomegaly. Electronically Signed   By: Virgina Norfolk M.D.   On: 06/20/2020 17:51   CT ABDOMEN PELVIS W CONTRAST  Result Date: 06/20/2020 CLINICAL DATA:  Cirrhosis, possible portal vein thrombosis, gallbladder wall thickening EXAM: CT ABDOMEN AND PELVIS WITH CONTRAST TECHNIQUE: Multidetector CT imaging of the abdomen and pelvis was performed using the standard protocol following bolus administration of intravenous contrast. CONTRAST:  192m OMNIPAQUE IOHEXOL 300 MG/ML  SOLN COMPARISON:  06/20/2020 FINDINGS: Lower chest: No acute pleural or parenchymal lung disease. Hepatobiliary:  Diffuse heterogeneity of the liver parenchyma, with nodularity of the liver capsule, compatible with known cirrhosis. No intrahepatic biliary duct dilation. Gallbladder is moderately distended, with pericholecystic fluid likely due to cirrhosis. No evidence of gallbladder wall thickening or calcified gallstones. Pancreas: Unremarkable. No pancreatic ductal dilatation or surrounding inflammatory changes. Spleen: Spleen is enlarged consistent with portal venous hypertension, measuring 16 cm in craniocaudal length. Adrenals/Urinary Tract: 3 mm nonobstructing calculus lower pole left kidney. Right kidney is unremarkable. The bladder is moderately distended with no filling defects.  The adrenals are normal. Stomach/Bowel: No bowel obstruction or ileus. There is nonspecific wall thickening within a segment of the ascending colon near the hepatic flexure. This could be due to under distension, inflammation/infection, or wall thickening due to adjacent right upper quadrant free fluid. There is a normal appendix. Vascular/Lymphatic: The aorta is unremarkable without atherosclerosis. The portal vein, splenic vein, and superior mesenteric vein opacify normally. No evidence of portal venous thrombosis. There is evidence of significant portal venous hypertension, with splenomegaly and marked splenic and gastric varices. No pathologic adenopathy within the abdomen or pelvis. Reproductive: Prostate is unremarkable. Other: Small volume ascites greatest in the right upper quadrant and lower pelvis. No free intraperitoneal gas. No abdominal wall hernia. Musculoskeletal: No acute or destructive bony lesions. Reconstructed images demonstrate no additional findings. IMPRESSION: 1. Normal opacification of the portal vein, SMV, and splenic vein. No evidence of portal vein thrombosis. 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 2. Cirrhosis.  No focal liver abnormality on this single  phase exam. 3. Distended gallbladder, with pericholecystic free fluid likely due to underlying cirrhosis. No evidence of cholecystitis or cholelithiasis. 4. Small volume ascites. 5. Nonspecific wall thickening at the hepatic flexure of the colon, which could be due to inflammatory/infectious colitis, under distension, or wall thickening due to adjacent free fluid in the right upper quadrant. Electronically Signed   By: Randa Ngo M.D.   On: 06/20/2020 21:53   DG Chest Port 1 View  Result Date: 06/21/2020 CLINICAL DATA:  Acute respiratory failure with hypoxia EXAM: PORTABLE CHEST 1 VIEW COMPARISON:  Portable exam 1110 hours without priors for comparison FINDINGS: Upper normal heart size. Mediastinal contours and pulmonary vascularity normal. Decreased lung volumes with mild RIGHT basilar atelectasis. No infiltrate, pleural effusion or pneumothorax. Osseous structures unremarkable. IMPRESSION: Decreased lung volumes with RIGHT basilar atelectasis. Electronically Signed   By: Lavonia Dana M.D.   On: 06/21/2020 11:39   EEG adult  Result Date: 06/21/2020 Lora Havens, MD     06/21/2020  8:58 AM Patient Name: Erice Ahles MRN: [id number] Epilepsy Attending: Lora Havens Referring Physician/Provider: Dr. Dyann Ruddle Date: 06/20/1020 Duration: 25 minutes Patient history: 39 year old male with history of alcohol use had generalized tonic-clonic seizure.  CT head on arrival showed multiple traumatic subarachnoid contusions.  EEG to evaluate for seizures. Level of alertness: Comatose AEDs during EEG study: Keppra Technical aspects: This EEG study was done with scalp electrodes positioned according to the 10-20 International system of electrode placement. Electrical activity was acquired at a sampling rate of _0  and reviewed with a high frequency filter of _1  and a low frequency filter of _2 . EEG data were recorded continuously and digitally stored. Description: EEG showed continuous generalized 2 to 3  Hz delta slowing with overriding 13 to 15 Hz generalized beta activity.  Hyperventilation and photic stimulation were not performed.   ABNORMALITY -Continuous slow, generalized -Excessive beta, generalized IMPRESSION: This study is suggestive of severe diffuse encephalopathy, nonspecific etiology but likely related to sedation.  No seizures or epileptiform discharges were seen throughout the recording. Priyanka Barbra Sarks     IMPRESSION:   *   Decompensated cirrhosis with thrombocytopenia, jaundice, coagulopathy, portal hypertension, splenic/gastric varices.   MELD is 20  Disc fx score: 33.   LFTs slightly better on recheck within 24 hours. Acute hepatitis serologies all non-reactive.   pndg labs include Hep B surf Ab, HCV quant, ammonia.  S/p PRBC, platelets and FFP, vitamin K.  Apparent history of substance abuse but no other history provided. Empiric lactulose in place.    *   Seizure, fall, CHI.    *   Encephalopathy.  Ammonia level pndg.    *    Normocytic anemia.  No GI bleed as of yet but does have the splenic/gastric varices noted on CT. From notes he had vigorous, hard to control bleeding from his scalp after yesterday's fall.   This is a likely source contributing to blood loss anemia Baseline anemia not known.      PLAN:     *   EGD.  8 AM w Dr Silverio Decamp.  Damaris Schooner w brother Merrily Pew (see below) and he can sign consent.    *   Continue Protonix.  ?  Add Octreotide, Rocephin, prednisolone?  *    Phone calls to his father and mother were either unanswered or went to voicemail. I did speak with his friend/employer Minda Ditto.    *   Continue Protonix 40 mg IV bid.  ? add prednisolone,   *   FOBT test ordered.     Azucena Freed  06/21/2020, 12:04 PM Phone [phone]  Addendum at 1 PM: Pt's brother Nivan Melendrez was in room as I left.  [phone]  [phone] is his phone number Merrily Pew says pt has mentioned vomiting and coughin up blood, headaches, back pain, blood in stool, easy  bruising, falls, lump at base of neck.  Hx heavy ETOH abuse, never would go to rehab or seek help.   Based on this I ordered octreotide gtt and rocephin.   Adding Prednisolone 40 mg po/daily.        Attending Physician Note   I have taken a history, examined the patient and reviewed the chart. I agree with the Advanced Practitioner's note, impression and recommendations.  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 No clinical evidence of cholecystitis * Severe anemia likely from scalp bleed however need to evaluate for UGI sources of blood loss, evaluate for varices, portal gastropathy, ulcer  Continue pantoprazole IV bid Start Rocephin Start prednisolone  Check ammonia  Trend CBC, CMP, PT/INR EGD tomorrow  Lucio Edward, MD [facility]

## 2020-06-21 NOTE — Progress Notes (Signed)
CRITICAL VALUE ALERT  Critical Value:  4.5 Hgb (after redraw)  Date & Time Notied:  06/21/2020 0500  Provider Notified: Ogan  Orders Received/Actions taken: Awaiting orders

## 2020-06-21 NOTE — Progress Notes (Signed)
   Providing Compassionate, Quality Care - Together  NEUROSURGERY PROGRESS NOTE   S: No issues overnight. Stable exam  O: EXAM:  BP 116/71   Pulse 69   Temp 98.2 F (36.8 C) (Axillary)   Resp (!) 22   Wt 108.4 kg Comment: Bed scale  SpO2 100%   Eyes open to pain, lethargic PERRLA EOMI Follows commands x4 Cervical collar in place Jaundice skin  ASSESSMENT:  39 y.o. male with   1.  Traumatic subarachnoid hemorrhages 2.  Seizures  Plan:  -repeat CT shows blossoming contusions and new contusions, basal cisterns remain open, no hydro or mass effect -Neuro ICU with every hour neuro checks -Seizure prophylaxis per neurology -No acute neurosurgical intervention  Thank you for allowing me to participate in this patient's care.  Please do not hesitate to call with questions or concerns.   Monia Pouch, DO Neurosurgeon [facility] Cell: [phone]

## 2020-06-22 ENCOUNTER — Inpatient Hospital Stay (HOSPITAL_COMMUNITY): Payer: Self-pay

## 2020-06-22 ENCOUNTER — Inpatient Hospital Stay (HOSPITAL_COMMUNITY): Payer: Self-pay | Admitting: Anesthesiology

## 2020-06-22 ENCOUNTER — Encounter (HOSPITAL_COMMUNITY)
Admission: EM | Disposition: A | Discharge status: Home / Self Care | Payer: Self-pay | Source: Home / Self Care | Attending: Internal Medicine

## 2020-06-22 DIAGNOSIS — K922 Gastrointestinal hemorrhage, unspecified: Secondary | ICD-10-CM

## 2020-06-22 DIAGNOSIS — K7011 Alcoholic hepatitis with ascites: Secondary | ICD-10-CM

## 2020-06-22 DIAGNOSIS — K7010 Alcoholic hepatitis without ascites: Secondary | ICD-10-CM

## 2020-06-22 DIAGNOSIS — K2100 Gastro-esophageal reflux disease with esophagitis, without bleeding: Secondary | ICD-10-CM

## 2020-06-22 DIAGNOSIS — F10939 Alcohol use, unspecified with withdrawal, unspecified: Secondary | ICD-10-CM

## 2020-06-22 DIAGNOSIS — K269 Duodenal ulcer, unspecified as acute or chronic, without hemorrhage or perforation: Secondary | ICD-10-CM

## 2020-06-22 DIAGNOSIS — K7460 Unspecified cirrhosis of liver: Secondary | ICD-10-CM

## 2020-06-22 DIAGNOSIS — D62 Acute posthemorrhagic anemia: Secondary | ICD-10-CM

## 2020-06-22 DIAGNOSIS — I8510 Secondary esophageal varices without bleeding: Secondary | ICD-10-CM

## 2020-06-22 DIAGNOSIS — F10239 Alcohol dependence with withdrawal, unspecified: Secondary | ICD-10-CM

## 2020-06-22 DIAGNOSIS — K766 Portal hypertension: Secondary | ICD-10-CM

## 2020-06-22 DIAGNOSIS — D696 Thrombocytopenia, unspecified: Secondary | ICD-10-CM

## 2020-06-22 DIAGNOSIS — K7031 Alcoholic cirrhosis of liver with ascites: Secondary | ICD-10-CM

## 2020-06-22 HISTORY — PX: ESOPHAGOGASTRODUODENOSCOPY (EGD) WITH PROPOFOL: SHX5813

## 2020-06-22 LAB — TYPE AND SCREEN
ABO/RH(D): O POS
Antibody Screen: NEGATIVE
Unit division: 0
Unit division: 0
Unit division: 0

## 2020-06-22 LAB — CBC WITH DIFFERENTIAL/PLATELET
Abs Immature Granulocytes: 0.02 10*3/uL (ref 0.00–0.07)
Basophils Absolute: 0 10*3/uL (ref 0.0–0.1)
Basophils Relative: 0 %
Eosinophils Absolute: 0 10*3/uL (ref 0.0–0.5)
Eosinophils Relative: 0 %
HCT: 26 % — ABNORMAL LOW (ref 39.0–52.0)
Hemoglobin: 8.3 g/dL — ABNORMAL LOW (ref 13.0–17.0)
Immature Granulocytes: 1 %
Lymphocytes Relative: 15 %
Lymphs Abs: 0.5 10*3/uL — ABNORMAL LOW (ref 0.7–4.0)
MCH: 26.6 pg (ref 26.0–34.0)
MCHC: 31.9 g/dL (ref 30.0–36.0)
MCV: 83.3 fL (ref 80.0–100.0)
Monocytes Absolute: 0.4 10*3/uL (ref 0.1–1.0)
Monocytes Relative: 11 %
Neutro Abs: 2.5 10*3/uL (ref 1.7–7.7)
Neutrophils Relative %: 73 %
Platelets: 45 10*3/uL — ABNORMAL LOW (ref 150–400)
RBC: 3.12 MIL/uL — ABNORMAL LOW (ref 4.22–5.81)
RDW: 19.3 % — ABNORMAL HIGH (ref 11.5–15.5)
WBC: 3.5 10*3/uL — ABNORMAL LOW (ref 4.0–10.5)
nRBC: 0.6 % — ABNORMAL HIGH (ref 0.0–0.2)

## 2020-06-22 LAB — GLUCOSE, CAPILLARY
Glucose-Capillary: 129 mg/dL — ABNORMAL HIGH (ref 70–99)
Glucose-Capillary: 102 mg/dL — ABNORMAL HIGH (ref 70–99)
Glucose-Capillary: 130 mg/dL — ABNORMAL HIGH (ref 70–99)
Glucose-Capillary: 194 mg/dL — ABNORMAL HIGH (ref 70–99)
Glucose-Capillary: 138 mg/dL — ABNORMAL HIGH (ref 70–99)

## 2020-06-22 LAB — COMPREHENSIVE METABOLIC PANEL
ALT: 24 U/L (ref 0–44)
AST: 95 U/L — ABNORMAL HIGH (ref 15–41)
Albumin: 2.4 g/dL — ABNORMAL LOW (ref 3.5–5.0)
Alkaline Phosphatase: 110 U/L (ref 38–126)
Anion gap: 9 (ref 5–15)
BUN: 9 mg/dL (ref 6–20)
CO2: 21 mmol/L — ABNORMAL LOW (ref 22–32)
Calcium: 7.2 mg/dL — ABNORMAL LOW (ref 8.9–10.3)
Chloride: 107 mmol/L (ref 98–111)
Creatinine, Ser: 0.93 mg/dL (ref 0.61–1.24)
GFR calc Af Amer: >60 mL/min (ref >60)
GFR calc non Af Amer: >60 mL/min (ref >60)
Glucose, Bld: 130 mg/dL — ABNORMAL HIGH (ref 70–99)
Potassium: 4.4 mmol/L (ref 3.5–5.1)
Sodium: 137 mmol/L (ref 135–145)
Total Bilirubin: 5.5 mg/dL — ABNORMAL HIGH (ref 0.3–1.2)
Total Protein: 5.5 g/dL — ABNORMAL LOW (ref 6.5–8.1)

## 2020-06-22 LAB — HCV AB W REFLEX TO QUANT PCR: HCV Ab: <0.1 s/co ratio (ref 0.0–0.9)

## 2020-06-22 LAB — BPAM RBC
Blood Product Expiration Date: 202110012359
Blood Product Expiration Date: 202110012359
Blood Product Expiration Date: 202110032359
ISSUE DATE / TIME: 202109010701
ISSUE DATE / TIME: 202109011028
ISSUE DATE / TIME: 202109011342
Unit Type and Rh: 5100
Unit Type and Rh: 5100
Unit Type and Rh: 5100

## 2020-06-22 LAB — PROTIME-INR
INR: 1.4 — ABNORMAL HIGH (ref 0.8–1.2)
Prothrombin Time: 16.7 seconds — ABNORMAL HIGH (ref 11.4–15.2)

## 2020-06-22 LAB — BPAM FFP
Blood Product Expiration Date: 202109022359
ISSUE DATE / TIME: 202109010701
Unit Type and Rh: 6200

## 2020-06-22 LAB — PREPARE FRESH FROZEN PLASMA: Unit division: 0

## 2020-06-22 LAB — HEPATITIS B SURFACE ANTIBODY, QUANTITATIVE: Hep B S AB Quant (Post): 34.4 m[IU]/mL (ref >9.9)

## 2020-06-22 LAB — PHOSPHORUS: Phosphorus: 4.4 mg/dL (ref 2.5–4.6)

## 2020-06-22 LAB — MAGNESIUM: Magnesium: 1.7 mg/dL (ref 1.7–2.4)

## 2020-06-22 LAB — HCV INTERPRETATION

## 2020-06-22 LAB — HAPTOGLOBIN: Haptoglobin: 24 mg/dL (ref 17–317)

## 2020-06-22 IMAGING — DX DG CHEST 1V PORT
1 series · 1 of 1 positions shown · non-contrast
Comparison: [DATE].

CLINICAL DATA: Respiratory failure.  Hypoxia.

EXAM:
PORTABLE CHEST 1 VIEW

[chest]
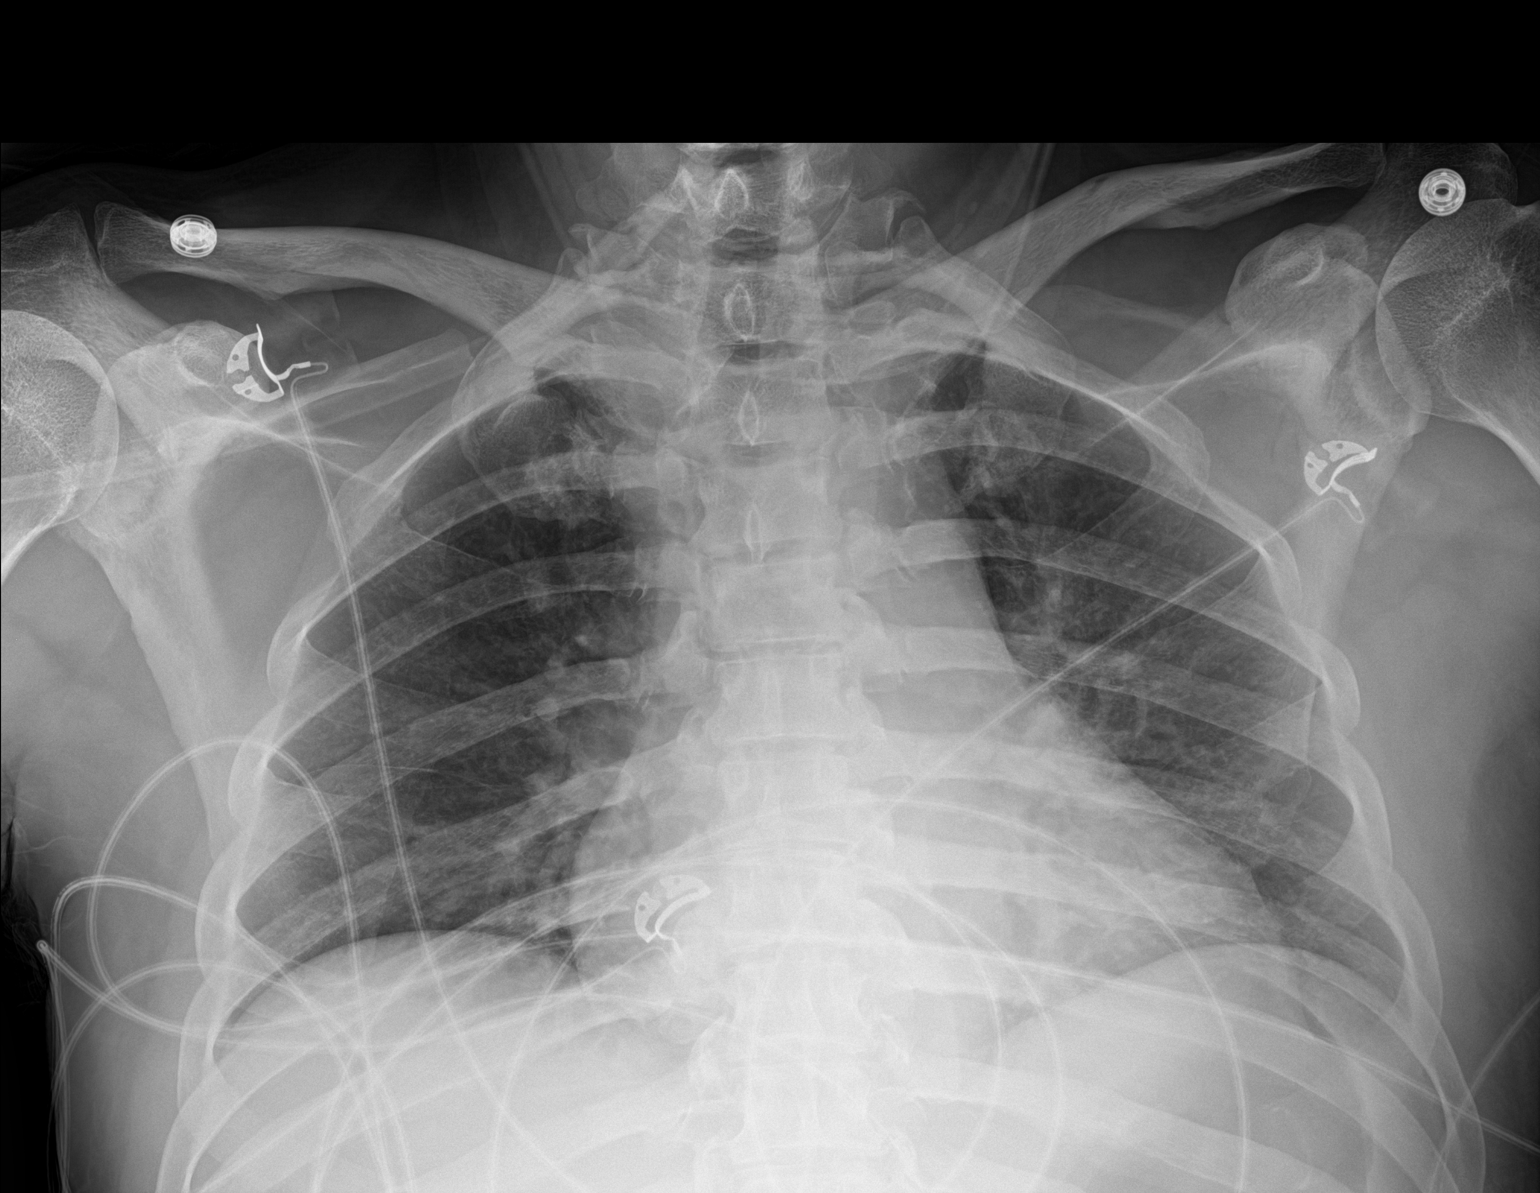

[1 of 1 positions shown; findings below may reference images not displayed]

FINDINGS: Mediastinum and hilar structures normal. Stable cardiomegaly. No
pulmonary venous congestion. Low lung volumes with bibasilar
atelectasis again noted. No pleural effusion or pneumothorax.
IMPRESSION: 1.  Stable cardiomegaly.  No pulmonary venous congestion.

2. Low lung volumes with bibasilar atelectasis again noted. Similar
findings on prior exam.

## 2020-06-22 SURGERY — ESOPHAGOGASTRODUODENOSCOPY (EGD) WITH PROPOFOL
Anesthesia: Monitor Anesthesia Care

## 2020-06-22 MED ORDER — LACTULOSE 10 GM/15ML PO SOLN
20.0000 g | Freq: Three times a day (TID) | ORAL | Status: DC
  Administered 2020-06-22 (×3): 20 g via ORAL
  Filled 2020-06-22 (×3): qty 30

## 2020-06-22 MED ORDER — LIDOCAINE 2% (20 MG/ML) 5 ML SYRINGE
INTRAMUSCULAR | Status: DC | PRN
  Administered 2020-06-22: 100 mg via INTRAVENOUS

## 2020-06-22 MED ORDER — PROPOFOL 10 MG/ML IV BOLUS
INTRAVENOUS | Status: DC | PRN
  Administered 2020-06-22 (×2): 20 mg via INTRAVENOUS
  Administered 2020-06-22: 40 mg via INTRAVENOUS

## 2020-06-22 MED ORDER — LACTULOSE 10 GM/15ML PO SOLN
20.0000 g | Freq: Three times a day (TID) | ORAL | Status: DC
  Filled 2020-06-22: qty 30

## 2020-06-22 MED ORDER — PANTOPRAZOLE SODIUM 40 MG PO TBEC
40.0000 mg | DELAYED_RELEASE_TABLET | Freq: Two times a day (BID) | ORAL | Status: DC
  Administered 2020-06-23 – 2020-06-30 (×15): 40 mg via ORAL
  Filled 2020-06-22 (×17): qty 1

## 2020-06-22 MED ORDER — PROPOFOL 500 MG/50ML IV EMUL
INTRAVENOUS | Status: DC | PRN
  Administered 2020-06-22: 100 ug/kg/min via INTRAVENOUS

## 2020-06-22 SURGICAL SUPPLY — 15 items

## 2020-06-22 NOTE — Progress Notes (Signed)
Pt noted to be in vent bigeminy, rate 90-120s. Spontaneously converted to sinus brady while attempting to obtain EKG. ELink notified, will obtain AM labs.

## 2020-06-22 NOTE — Op Note (Addendum)
[facility] Patient Name: Matthew GladBenjamin Eddings Procedure Date : 06/22/2020 MRN: [id number] Attending MD: Napoleon FormKavitha V. Mikyah Alamo , MD Date of Birth: 1981/08/23 CSN: [id number] Age: [age] Admit Type: Inpatient Procedure:                Upper GI endoscopy Indications:              Recent gastrointestinal bleeding, Suspected upper                            gastrointestinal bleeding, Suspected upper                            gastrointestinal bleeding in patient with chronic                            blood loss Providers:                Napoleon FormKavitha V. Almin Livingstone, MD, Norman ClayLisa Nunn, RN, Faustina                            Mbumina, Technician Referring MD:              Medicines:                Monitored Anesthesia Care Complications:            No immediate complications. Estimated Blood Loss:     Estimated blood loss was minimal. Procedure:                Pre-Anesthesia Assessment:                           - Prior to the procedure, a History and Physical                            was performed, and patient medications and                            allergies were reviewed. The patient's tolerance of                            previous anesthesia was also reviewed. The risks                            and benefits of the procedure and the sedation                            options and risks were discussed with the patient.                            All questions were answered, and informed consent                            was obtained. Prior Anticoagulants: The patient has                            taken no previous anticoagulant  or antiplatelet                            agents. ASA Grade Assessment: III - A patient with                            severe systemic disease. After reviewing the risks                            and benefits, the patient was deemed in                            satisfactory condition to undergo the procedure.                           After obtaining  informed consent, the endoscope was                            passed under direct vision. Throughout the                            procedure, the patient's blood pressure, pulse, and                            oxygen saturations were monitored continuously. The                            GIF-H190 ([id number]) Olympus gastroscope was                            introduced through the mouth, and advanced to the                            second part of duodenum. The upper GI endoscopy was                            accomplished without difficulty. The patient                            tolerated the procedure well. Scope In: Scope Out: Findings:      LA Grade B (one or more mucosal breaks greater than 5 mm, not extending       between the tops of two mucosal folds) esophagitis with no bleeding was       found 36 to 38 cm from the incisors.      Grade I, small (< 1 mm) varices were found in the lower third of the       esophagus. They were less than 1 mm in largest diameter.      Moderate portal hypertensive gastropathy was found in the entire       examined stomach. No gastric varices on endoscopic view.      Few non-bleeding cratered duodenal ulcers with a clean ulcer base       (Forrest Class III) were found in the duodenal bulb. The largest lesion       was 5  mm in largest dimension.      Retained fluid was found in the gastric body. Fluid (>1L) was suctioned       through scope. Impression:               - LA Grade B reflux esophagitis with no bleeding.                           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                           - Portal hypertensive gastropathy.                           - Non-bleeding duodenal ulcers with a clean ulcer                            base (Forrest Class III).                           - Retained gastric fluid. Fluid (>1L) was suctioned                            through scope. Recommendation:           - Patient has a contact  number available for                            emergencies. The signs and symptoms of potential                            delayed complications were discussed with the                            patient. Return to normal activities tomorrow.                            Written discharge instructions were provided to the                            patient.                           - Advance diet as tolerated.                           - Continue present medications.                           - Continue Ceftriaxone for SBP prophylaxis for 5                            day course                           - Ok to DC Octreotide                           -  Continue Lactulose with goal 3BM per day                           - Use Protonix (pantoprazole) 40 mg PO BID.                           - No ibuprofen, naproxen, or other non-steroidal                            anti-inflammatory drugs.                           - Called and updated family (Brother Kasem Mozer) Procedure Code(s):        --- Professional ---                           [phone], Esophagogastroduodenoscopy, flexible,                            transoral; diagnostic, including collection of                            specimen(s) by brushing or washing, when performed                            (separate procedure) Diagnosis Code(s):        --- Professional ---                           K21.00, Gastro-esophageal reflux disease with                            esophagitis, without bleeding                           I85.00, Esophageal varices without bleeding                           K76.6, Portal hypertension                           K31.89, Other diseases of stomach and duodenum                           K26.9, Duodenal ulcer, unspecified as acute or                            chronic, without hemorrhage or perforation                           K92.2, Gastrointestinal hemorrhage, unspecified                           R58,  Hemorrhage, not elsewhere classified CPT copyright 2019 [employer] Medical [employer]. All rights reserved. The codes documented in this report are preliminary and upon coder review may  be revised to meet current compliance requirements. Napoleon Form, MD  06/22/2020 9:23:03 AM This report has been signed electronically. Number of Addenda: 0

## 2020-06-22 NOTE — Progress Notes (Signed)
NAME:  Matthew Horn, MRN:  [id number], DOB:  1981-05-26, LOS: 2 ADMISSION DATE:  06/20/2020, CONSULTATION DATE: 830 10/2019 REFERRING MD: [facility] ED, CHIEF COMPLAINT: ICH  HPI/course in hospital  39 year old man with significant alcohol history.  Witnessed generalized tonic-clonic seizure with loss of consciousness while exiting restaurant today.  Brought here by EMS.  3 seizures en route which terminated with Versed 5 mg  Found to have a large superficial skull laceration on the right occiput which was stapled.  CT scan shows 2 small areas of intraparenchymal hemorrhage.  Patient remains confused and is not able to provide reliable corroborating history.  He apparently is from out of town.   Per brother  Past Medical History  No past medical history on file.  Patient is not able to provide reliable history.  Review of Systems:   Unable to obtain due to AMS  Social History    Unknown Family History   His family history is not on file.   Allergies No Known Allergies   Home Medications  Prior to Admission medications   Not on File     Interim history/subjective:   Patient is still lethargic, slightly more responsive, per o/n team pt has been in and out of vent bigeminy, converting back to sinus spontaneously.   Per mother at bedside, pt last drink was 5 days ago. She felt seizure was related to his detox which would have been between 48-72hrs after last reported drink.    Objective   Blood pressure 125/80, pulse (!) 54, temperature 97.9 F (36.6 C), temperature source Axillary, resp. rate 14, weight 108.4 kg, SpO2 99 %.        Intake/Output Summary (Last 24 hours) at 06/22/2020 1040 Last data filed at 06/22/2020 1000 Gross per 24 hour  Intake 4841.34 ml  Output 1100 ml  Net 3741.34 ml   Filed Weights   06/20/20 1400  Weight: 108.4 kg    Examination: General: poorly groomed, chronically ill appearing HEENT: Scleral icterus present, cervical collar in  place CV: RRR Lungs: CTAB Abd: soft, no stigmata of chronic liver disease Skin: Jaundice present Neuro: confused, sedated, moving all limbs with painful stimuli, slightly more responsive today   Ancillary tests (personally reviewed)  CBC: Recent Labs  Lab 06/20/20 1343 06/20/20 1343 06/21/20 0100 06/21/20 1046 06/21/20 1745 06/21/20 2041 06/22/20 0353  WBC 11.8*   < > 2.3* 2.8* 3.9* 3.8* 3.5*  NEUTROABS 5.2  --  1.4*  --   --   --  2.5  HGB 8.6*   < > 4.5* 5.6* 8.4* 8.6* 8.3*  HCT 30.5*   < > 14.5* 18.2* 26.5* 26.5* 26.0*  MCV 84.7   < > 76.3* 80.2 82.0 82.6 83.3  PLT 41*   < > 35* 37* 41* 41* 45*   < > = values in this interval not displayed.    Basic Metabolic Panel: Recent Labs  Lab 06/20/20 1343 06/21/20 0100 06/22/20 0353  NA 135 135 137  K 3.6 3.5 4.4  CL 101 103 107  CO2 9* 21* 21*  GLUCOSE 105* 100* 130*  BUN 6 <5* 9  CREATININE 1.14 0.77 0.93  CALCIUM 8.0* 7.1* 7.2*  MG  --  1.6* 1.7  PHOS  --  3.6 4.4   GFR: CrCl cannot be calculated (Unknown ideal weight.). Recent Labs  Lab 06/21/20 1046 06/21/20 1745 06/21/20 2041 06/22/20 0353  WBC 2.8* 3.9* 3.8* 3.5*    Liver Function Tests: Recent Labs  Lab 06/20/20  1343 06/21/20 0100 06/22/20 0353  AST 156* 104*   102* 95*  ALT 27 28   24 24   ALKPHOS 179* 119   120 110  BILITOT 6.2* 5.5*   5.2* 5.5*  PROT 7.2 5.1*   5.0* 5.5*  ALBUMIN 3.1* 2.3*   2.2* 2.4*   No results for input(s): LIPASE, AMYLASE in the last 168 hours. Recent Labs  Lab 06/21/20 1745  AMMONIA 40*    ABG No results found for: PHART, PCO2ART, PO2ART, HCO3, TCO2, ACIDBASEDEF, O2SAT   Coagulation Profile: Recent Labs  Lab 06/20/20 1432 06/21/20 0100 06/22/20 0353  INR 1.7* 1.6* 1.4*    Cardiac Enzymes: No results for input(s): CKTOTAL, CKMB, CKMBINDEX, TROPONINI in the last 168 hours.  HbA1C: No results found for: HGBA1C  CBG: Recent Labs  Lab 06/21/20 1119 06/21/20 1927 06/21/20 2007 06/21/20 2312  06/22/20 0326  GLUCAP 82 69* 129* 114* 129*   Antibiotics: Ceftriaxone  9/1 >  Studies / Tests: CT head 9/1 > worsening multifocal intraparenchymal hemorrhage with some new foci noted in the occipital and posterior parietal lobes without any midline shift or mass-effect EEG 8/31-9/1 > generalized continuous slowing, generalized excessive beta activity consistent with severe diffuse encephalopathy without any evidence of epileptiform discharges or seizures CT abdomen 8/31 > no evidence of portal vein thrombosis, severe portal venous hypertension with splenomegaly and splenic and gastric varices.  Cirrhosis with a distended gallbladder without cholecystitis or cholelithiasis, nonspecific hepatic flexure colonic wall thickening CT abdomen 9/1 > No retroperitoneal bleeding, new tiny bilateral pleural effusions, otherwise unchanged from previous exam EGD 9/2 > duodenal ulcers without active bleeding, grade 1 small esophageal varices, evidence for esophagitis and portal gastropathy  Cultures: Blood 9/1 >> Urine 9/1 >>   Assessment & Plan:  Critically ill due to traumatic intraparenchymal hemorrhage following fall related to witnessed seizure. Mild confusion with no focal deficits. Mechanism and pattern of injury suggest patient at risk for further decompensation as cerebral contusions involved. ICH stable on repeat CT from 9/1. - Continue frequent neurological monitoring - Keep blood pressure systolic less than 160.  Alcoholic Hepatitis with Liver Cirrhosis Jaundiced with thrombocytopenia. CT Abd suggestive of alcoholic cirrhosis. MELD-NA score of 21. Hepatitis panel negative. Low threshold to place NG tube if patient is unable to tolerate PO lactulose.   - Prednisolone 40 mg PO QD - Lactulose 20g PO TID - Ceftriaxone 1g IV QD for SBP prophylaxis initiated on 9/1 - Consider addition of rifaximin 400 mg TID if not improved initially on lactulose - Monitor coagulopathy  Acute Drop in Hg  concerning for large active bleed most likely 2/2 duodenal ulcers ICH would not explain such a large drop in Hgb (8.6->4.5). No signs of retroperitoneal hemorrhage on Abdominal CT yesterday but significant varices noted. Hx chronic GI bleed per pt brother. Repeat abd CT from 9/1 negative for retroperitoneal bleed EGD on 9/2 showed duodenal ulcers, no active bleeding varices Patient is s/p 3 u pRBCs and Hgb has been stable since.  - Protonix 40 mg PO BID - Octreotide discontinued per GI, appreciate recs - Avoid NSAIDs  Recent seizure in the setting of alcohol withdrawal Per pts mother, pt had been trying to wean himself off alcohol prior to his seizure-like event. No signs of seizure activity while in CCU. EEG on 9/1 showed no seizure activity.  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 - CIWA protocol. -  Precedex stopped at this time but will leave on PRN in favor of Ativan at this time   Leukopenia Acute drop of WBCs to 2.8 in the presence of alcohol use - Follow up on blood cultures, no growth at <24h  - Monitor CBC daily  Daily Goals Checklist  Pain/Anxiety/Delirium protocol (if indicated): CIWA only Neuro vitals: every 1 hours AED's: Keppra VAP protocol (if indicated): Not intubated Respiratory support goals: Titrate oxygen to sat greater than 92% Blood pressure target: Keep systolic blood pressure less than 160 DVT prophylaxis: SCDs only Nutrition Status: Clear liquids GI prophylaxis: Not indicated Fluid status goals: Allow autoregulation Urinary catheter: External catheter only Central lines: PIV Glucose control: Monitor Mobility/therapy needs: Bedrest, needs restraints to prevent self-harm Antibiotic de-escalation: ceftriaxone for SBP prophylaxis  Home medication reconciliation: None known Daily labs: CBC, CMP daily Code Status: Full code Family Communication:  Mother at bedside Disposition: ICU  Anette Riedel, MS4

## 2020-06-22 NOTE — Anesthesia Preprocedure Evaluation (Signed)
Anesthesia Evaluation  Patient identified by MRN, date of birth, ID band Patient awake    Reviewed: Allergy & Precautions, H&P , NPO status , Patient's Chart, lab work & pertinent test results  Airway Mallampati: II   Neck ROM: full    Dental   Pulmonary neg pulmonary ROS,    breath sounds clear to auscultation       Cardiovascular negative cardio ROS   Rhythm:regular Rate:Normal     Neuro/Psych    GI/Hepatic (+) Cirrhosis       , Gastric varices. hematemesis   Endo/Other    Renal/GU      Musculoskeletal   Abdominal   Peds  Hematology  (+) anemia ,   Anesthesia Other Findings   Reproductive/Obstetrics                             Anesthesia Physical Anesthesia Plan  ASA: II  Anesthesia Plan: MAC   Post-op Pain Management:    Induction: Intravenous  PONV Risk Score and Plan: 1 and Propofol infusion and Treatment may vary due to age or medical condition  Airway Management Planned: Nasal Cannula  Additional Equipment:   Intra-op Plan:   Post-operative Plan:   Informed Consent: I have reviewed the patients History and Physical, chart, labs and discussed the procedure including the risks, benefits and alternatives for the proposed anesthesia with the patient or authorized representative who has indicated his/her understanding and acceptance.       Plan Discussed with: CRNA, Anesthesiologist and Surgeon  Anesthesia Plan Comments:         Anesthesia Quick Evaluation

## 2020-06-22 NOTE — Progress Notes (Signed)
NAME:  Matthew Horn, MRN:  [id number], DOB:  November 07, 1980, LOS: 2 ADMISSION DATE:  06/20/2020, CONSULTATION DATE: 830 10/2019 REFERRING MD: [facility] ED, CHIEF COMPLAINT: ICH  HPI/course in hospital  39 year old man with significant alcohol history.  Witnessed generalized tonic-clonic seizure with loss of consciousness while exiting restaurant today.  Brought here by EMS.  3 seizures en route which terminated with Versed 5 mg  Found to have a large superficial skull laceration on the right occiput which was stapled.  CT scan shows 2 small areas of intraparenchymal hemorrhage.  Patient remains confused and is not able to provide reliable corroborating history.  He apparently is from out of town.    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  Cirrhosis with a distended gallbladder without cholecystitis or cholelithiasis, nonspecific hepatic flexure colonic wall thickening EGD 9/2 >> duodenal ulcers without active bleeding, grade 1 small esophageal varices, evidence for esophagitis and portal gastropathy  Antibiotics: Ceftriaxone (SBP prophylaxis) 9/1 >>    Interim history/subjective:   Reportedly able to take some applesauce p.o., his lactulose overnight Some bigeminy reported on telemetry, not currently Went for EGD this morning as above   Objective   Blood pressure 126/86, pulse (!) 54, temperature 97.9 F (36.6 C), temperature source Axillary, resp. rate 14, weight 108.4 kg, SpO2 100 %.         Intake/Output Summary (Last 24 hours) at 06/22/2020 1028 Last data filed at 06/22/2020 1000 Gross per 24 hour  Intake 4841.34 ml  Output 500 ml  Net 4341.34 ml   Filed Weights   06/20/20 1400  Weight: 108.4 kg    Examination: Gen: Ill-appearing jaundiced man  HEENT: Scleral icterus, oropharynx dry, c-collar in place Lungs: Coarse bilaterally, no crackles, no wheezes CV: Regular, distant, no murmur Abd: Mildly distended, hypoactive bowel sounds GU: Yellow urine Neuro: Somnolent, does attempt to speak, moves upper extremities spontaneously and to request.   Ancillary tests (personally reviewed)  CBC: Recent Labs  Lab 06/20/20 1343 06/20/20 1343 06/21/20 0100 06/21/20 1046 06/21/20 1745 06/21/20 2041 06/22/20 0353  WBC 11.8*   < > 2.3* 2.8* 3.9* 3.8* 3.5*  NEUTROABS 5.2  --  1.4*  --   --   --  2.5  HGB 8.6*   < > 4.5* 5.6* 8.4* 8.6* 8.3*  HCT 30.5*   < > 14.5* 18.2* 26.5* 26.5* 26.0*  MCV 84.7   < > 76.3* 80.2 82.0 82.6 83.3  PLT 41*   < > 35* 37* 41* 41* 45*   < > = values in this interval not displayed.    Basic Metabolic Panel: Recent Labs  Lab 06/20/20 1343 06/21/20 0100 06/22/20 0353  NA 135 135 137  K 3.6 3.5 4.4  CL 101 103 107  CO2 9* 21* 21*  GLUCOSE 105* 100* 130*  BUN 6 <5* 9  CREATININE 1.14 0.77 0.93  CALCIUM 8.0* 7.1* 7.2*  MG  --  1.6* 1.7  PHOS  --  3.6 4.4   GFR: CrCl cannot be calculated (Unknown ideal weight.). Recent Labs  Lab 06/21/20 1046 06/21/20 1745 06/21/20 2041 06/22/20 9242  WBC 2.8* 3.9* 3.8* 3.5*    Liver Function Tests: Recent Labs  Lab 06/20/20 1343 06/21/20 0100 06/22/20 0353  AST 156* 104*  102* 95*  ALT 27 28  24 24   ALKPHOS 179* 119  120 110  BILITOT 6.2* 5.5*  5.2* 5.5*  PROT 7.2 5.1*  5.0* 5.5*  ALBUMIN 3.1* 2.3*  2.2* 2.4*   No results for input(s): LIPASE, AMYLASE in the last 168 hours. Recent Labs  Lab 06/21/20 1745  AMMONIA 40*    ABG No results found for: PHART, PCO2ART, PO2ART,  HCO3, TCO2, ACIDBASEDEF, O2SAT   Coagulation Profile: Recent Labs  Lab 06/20/20 1432 06/21/20 0100 06/22/20 0353  INR 1.7* 1.6* 1.4*    Cardiac Enzymes: No results for input(s): CKTOTAL, CKMB, CKMBINDEX, TROPONINI in the last 168 hours.  HbA1C: No results found for: HGBA1C  CBG: Recent Labs  Lab 06/21/20 1119 06/21/20 1927 06/21/20 2007 06/21/20 2312 06/22/20 0326  GLUCAP 82 69* 129* 114* 129*     Assessment & Plan:   Traumatic intraparenchymal SA hemorrhage following fall related to witnessed seizure.  Most recent CT stable 9/1.  Still with a c-collar in place. Serial neuro checks Tight blood pressure control, goal SBP 160 Follow repeat imaging as indicated for any changes in his mentation.  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  Overnight EEG without any evidence of active seizures 9/1 Continue Keppra Currently on Precedex 0.4 for residual alcohol withdrawal symptoms, wean as able, goal to off over next 24 hours   Evolving acute respiratory failure due to inadequate airway protection, upper airway control and secretions. Follow airway protection closely.  No indication for intubation at this time.  Severe acute anemia cause unknown without any overt evidence of blood loss currently.  Suspect that this was gastrointestinal but no bowel movements reported yet.  No retroperitoneal blood on CT done 8/31 Appreciate gastroenterology assistance.  No active bleeding noted on EGD 9/2 Octreotide added to PPI on 9/1, okay to discontinue today 9/2 in absence of large varices Follow serial CBC PPI twice daily  Leukopenia, ? Possible evolving infection Following CBC and overall clinical status for possible infection Note ceftriaxone added 9/1  Alcoholism, at risk for withdrawal Wean Precedex as  able, goal off in the next 24 hours  Alcoholic cirrhosis, jaundice.  Probable superimposed alcoholic hepatitis Appreciate gastroenterology assistance Prednisone added 9/1 for alcoholic hepatitis Lactulose 3 times daily.  Will place an NG tube if he cannot take p.o. adequately Follow LFT, coags  Thrombocytopenia Follow CBC.  Hepatic coagulopathy Received FFP 8/31 in setting of his intracranial bleeding Follow serial INR Vitamin K given on 8/31 and 9/1   Daily Goals Checklist  Pain/Anxiety/Delirium protocol (if indicated): CIWA only, precedex Neuro vitals: every 1 hours AED's: Keppra VAP protocol (if indicated): Not intubated Respiratory support goals: Titrate oxygen to sat greater than 92% Blood pressure target: Keep systolic blood pressure less than 160 DVT prophylaxis: SCDs only Nutrition Status: P.o. diet GI prophylaxis: PPI BID Fluid status goals: Allow autoregulation Urinary catheter: External catheter only Central lines: PIV Glucose control: Monitor Mobility/therapy needs: Bedrest, needs restraints to prevent self-harm Antibiotic de-escalation: Ceftriaxone for SBP prophylaxis Home medication reconciliation: None known Code Status: Full code Family Communication: updated brother at bedside on 9/1, mother on 9/2 Disposition: ICU  CRITICAL CARE Performed by: 11/2  Independent CC time 33 minutes   Levy Pupa, MD, PhD 06/22/2020, 10:28 AM [facility] [phone] or if no answer [phone]

## 2020-06-22 NOTE — Progress Notes (Signed)
eLink Physician-Brief Progress Note Patient Name: Matthew Horn DOB: October 02, 1981 MRN: [id number]   Date of Service  06/22/2020  HPI/Events of Note  Agitation - Request for Posey belt.  eICU Interventions  Will order Posey Belt X 11 hours.     Intervention Category Major Interventions: Delirium, psychosis, severe agitation - evaluation and management  Deven Furia [place] 06/22/2020, 9:17 PM

## 2020-06-22 NOTE — Transfer of Care (Signed)
Immediate Anesthesia Transfer of Care Note  Patient: Eleanor Dimichele  Procedure(s) Performed: ESOPHAGOGASTRODUODENOSCOPY (EGD) WITH PROPOFOL (N/A )  Patient Location: Endoscopy Unit  Anesthesia Type:MAC  Level of Consciousness: drowsy  Airway & Oxygen Therapy: Patient Spontanous Breathing and Patient connected to nasal cannula oxygen  Post-op Assessment: Report given to RN and Post -op Vital signs reviewed and stable  Post vital signs: Reviewed and stable  Last Vitals:  Vitals Value Taken Time  BP 109/66 (78)   Temp    Pulse 64 06/22/20 0842  Resp 15 06/22/20 0842  SpO2 100 % 06/22/20 0842  Vitals shown include unvalidated device data.  Last Pain:  Vitals:   06/22/20 0729  TempSrc: Axillary  PainSc: 0-No pain         Complications: No complications documented.

## 2020-06-22 NOTE — Interval H&P Note (Signed)
History and Physical Interval Note:  06/22/2020 8:15 AM  [name]  has presented today for surgery, with the diagnosis of hx hematemesis.  Anemia, gastric and splenic varices on CT.  cirrhosis.  The various methods of treatment have been discussed with the patient and family. After consideration of risks, benefits and other options for treatment, the patient has consented to  Procedure(s): ESOPHAGOGASTRODUODENOSCOPY (EGD) WITH PROPOFOL (N/A) as a surgical intervention.  The patient's history has been reviewed, patient examined, no change in status, stable for surgery.  I have reviewed the patient's chart and labs.  Questions were answered to the patient's satisfaction.     Matthew Horn

## 2020-06-22 NOTE — Progress Notes (Signed)
   Providing Compassionate, Quality Care - Together  NEUROSURGERY PROGRESS NOTE   S: No issues overnight. More alert  O: EXAM:  BP (!) 142/98   Pulse 70   Temp 99.4 F (37.4 C) (Oral)   Resp 18   Wt 108.4 kg Comment: Bed scale  SpO2 98%   Awake, alert, disoriented  Speech fluent,  PERRL EOMI FC x4 In restraints, but MAE equally jaundiced  ASSESSMENT:  39 y.o. male with   1.Traumatic subarachnoid hemorrhages 2.Seizures  Plan: -repeat CT stable -Neuro ICU with every hour neuro checks -Seizure prophylaxis -will likely need rehab -will signoff at this time, please call with questions or concerns    Thank you for allowing me to participate in this patient's care.  Please do not hesitate to call with questions or concerns.   Monia Pouch, DO Neurosurgeon [facility] Cell: [phone]

## 2020-06-23 ENCOUNTER — Inpatient Hospital Stay (HOSPITAL_COMMUNITY): Payer: Self-pay

## 2020-06-23 DIAGNOSIS — R791 Abnormal coagulation profile: Secondary | ICD-10-CM

## 2020-06-23 DIAGNOSIS — K7010 Alcoholic hepatitis without ascites: Secondary | ICD-10-CM

## 2020-06-23 DIAGNOSIS — I629 Nontraumatic intracranial hemorrhage, unspecified: Secondary | ICD-10-CM

## 2020-06-23 DIAGNOSIS — D696 Thrombocytopenia, unspecified: Secondary | ICD-10-CM

## 2020-06-23 DIAGNOSIS — K269 Duodenal ulcer, unspecified as acute or chronic, without hemorrhage or perforation: Secondary | ICD-10-CM

## 2020-06-23 LAB — CBC WITH DIFFERENTIAL/PLATELET
Abs Immature Granulocytes: 0.02 10*3/uL (ref 0.00–0.07)
Basophils Absolute: 0 10*3/uL (ref 0.0–0.1)
Basophils Relative: 0 %
Eosinophils Absolute: 0 10*3/uL (ref 0.0–0.5)
Eosinophils Relative: 1 %
HCT: 25.9 % — ABNORMAL LOW (ref 39.0–52.0)
Hemoglobin: 8.2 g/dL — ABNORMAL LOW (ref 13.0–17.0)
Immature Granulocytes: 0 %
Lymphocytes Relative: 25 %
Lymphs Abs: 1.3 10*3/uL (ref 0.7–4.0)
MCH: 26.6 pg (ref 26.0–34.0)
MCHC: 31.7 g/dL (ref 30.0–36.0)
MCV: 84.1 fL (ref 80.0–100.0)
Monocytes Absolute: 0.8 10*3/uL (ref 0.1–1.0)
Monocytes Relative: 15 %
Neutro Abs: 3.1 10*3/uL (ref 1.7–7.7)
Neutrophils Relative %: 59 %
Platelets: 72 10*3/uL — ABNORMAL LOW (ref 150–400)
RBC: 3.08 MIL/uL — ABNORMAL LOW (ref 4.22–5.81)
RDW: 21.1 % — ABNORMAL HIGH (ref 11.5–15.5)
WBC: 5.2 10*3/uL (ref 4.0–10.5)
nRBC: 0.4 % — ABNORMAL HIGH (ref 0.0–0.2)

## 2020-06-23 LAB — COMPREHENSIVE METABOLIC PANEL
ALT: 23 U/L (ref 0–44)
AST: 84 U/L — ABNORMAL HIGH (ref 15–41)
Albumin: 2.4 g/dL — ABNORMAL LOW (ref 3.5–5.0)
Alkaline Phosphatase: 104 U/L (ref 38–126)
Anion gap: 9 (ref 5–15)
BUN: 8 mg/dL (ref 6–20)
CO2: 22 mmol/L (ref 22–32)
Calcium: 7.5 mg/dL — ABNORMAL LOW (ref 8.9–10.3)
Chloride: 106 mmol/L (ref 98–111)
Creatinine, Ser: 0.96 mg/dL (ref 0.61–1.24)
GFR calc Af Amer: >60 mL/min (ref >60)
GFR calc non Af Amer: >60 mL/min (ref >60)
Glucose, Bld: 107 mg/dL — ABNORMAL HIGH (ref 70–99)
Potassium: 3.9 mmol/L (ref 3.5–5.1)
Sodium: 137 mmol/L (ref 135–145)
Total Bilirubin: 4.9 mg/dL — ABNORMAL HIGH (ref 0.3–1.2)
Total Protein: 6 g/dL — ABNORMAL LOW (ref 6.5–8.1)

## 2020-06-23 LAB — URINE CULTURE: Culture: >=100000 — AB

## 2020-06-23 LAB — GLUCOSE, CAPILLARY
Glucose-Capillary: 126 mg/dL — ABNORMAL HIGH (ref 70–99)
Glucose-Capillary: 110 mg/dL — ABNORMAL HIGH (ref 70–99)
Glucose-Capillary: 105 mg/dL — ABNORMAL HIGH (ref 70–99)
Glucose-Capillary: 139 mg/dL — ABNORMAL HIGH (ref 70–99)
Glucose-Capillary: 145 mg/dL — ABNORMAL HIGH (ref 70–99)
Glucose-Capillary: 121 mg/dL — ABNORMAL HIGH (ref 70–99)

## 2020-06-23 LAB — PROTIME-INR
INR: 1.5 — ABNORMAL HIGH (ref 0.8–1.2)
Prothrombin Time: 17.1 seconds — ABNORMAL HIGH (ref 11.4–15.2)

## 2020-06-23 LAB — PHOSPHORUS: Phosphorus: 2.9 mg/dL (ref 2.5–4.6)

## 2020-06-23 LAB — MAGNESIUM: Magnesium: 1.7 mg/dL (ref 1.7–2.4)

## 2020-06-23 IMAGING — CT CT HEAD W/O CM
4 series · 16 of 47 positions shown, 18 images · non-contrast
Comparison: Multiple recent exams, initially [DATE]. Most
recent [DATE].

CLINICAL DATA: Follow-up intracranial hemorrhage.

EXAM:
CT HEAD WITHOUT CONTRAST
TECHNIQUE: Contiguous axial images were obtained from the base of the skull
through the vertex without intravenous contrast.

[Series 3: head without · axial · non-contrast · 0.48mm/px · z∈[-138,-8]mm · 7 of 36 slices shown, 9 images]
[im 5/36  brain]
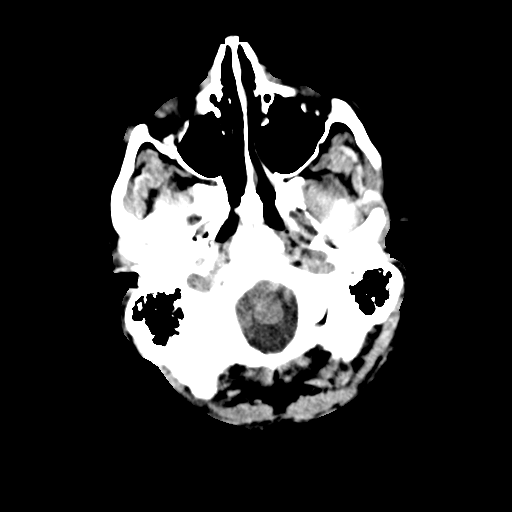
[im 5/36  bone]
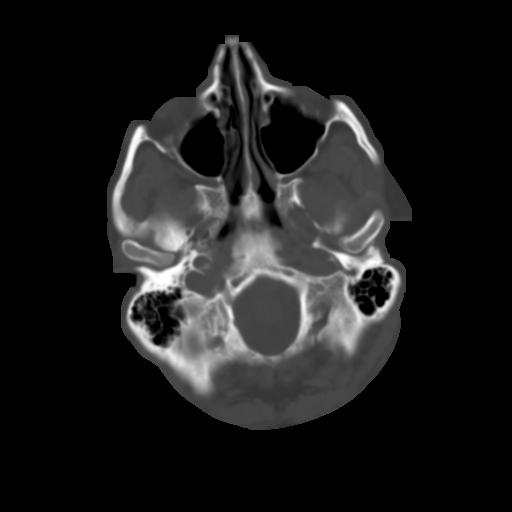
[im 9/36  brain]
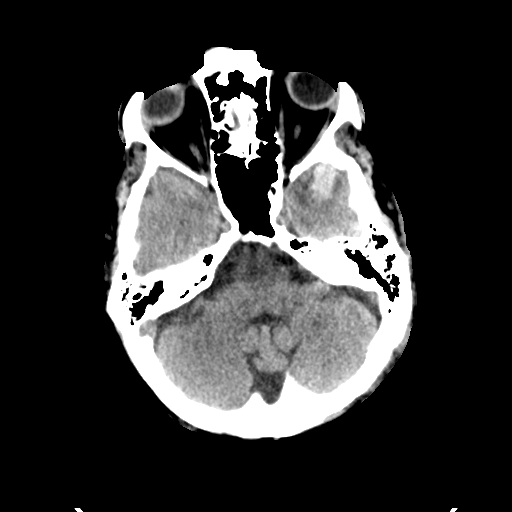
[im 14/36  brain]
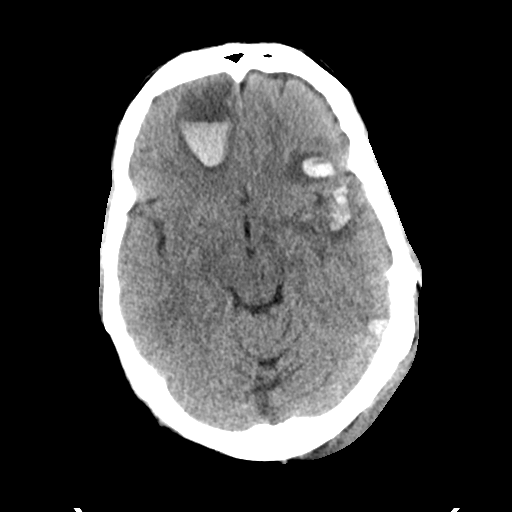
[im 18/36  brain]
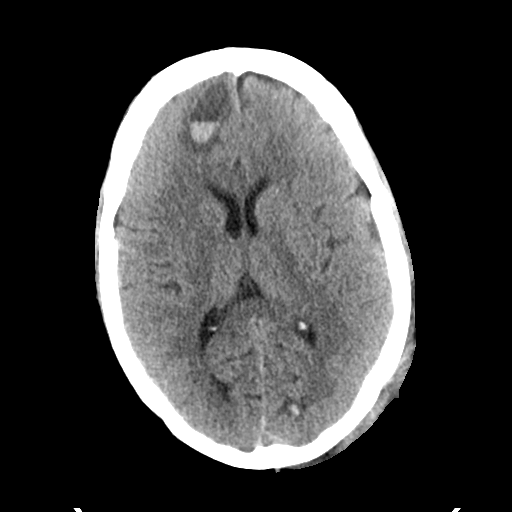
[im 22/36  brain]
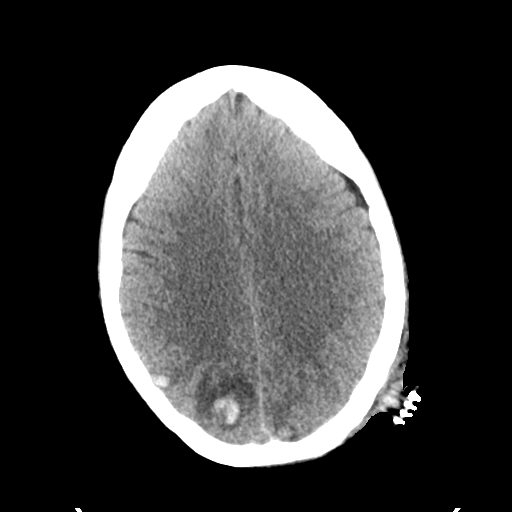
[im 22/36  bone]
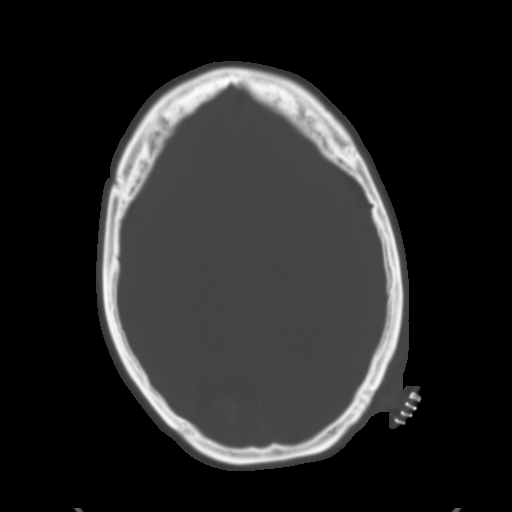
[im 27/36  brain]
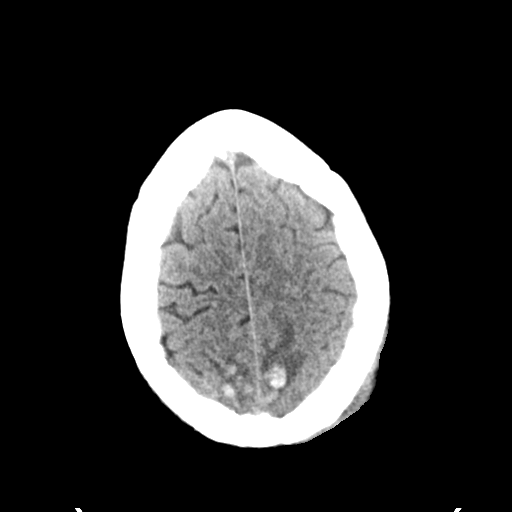
[im 31/36  brain]
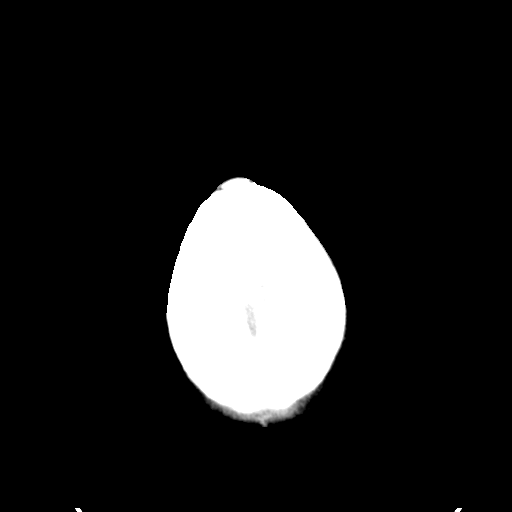

[Series 4: head bone · axial · 0.48mm/px · z∈[-142,-106]mm · 3 of 89 slices shown]
[im 9/89  bone]
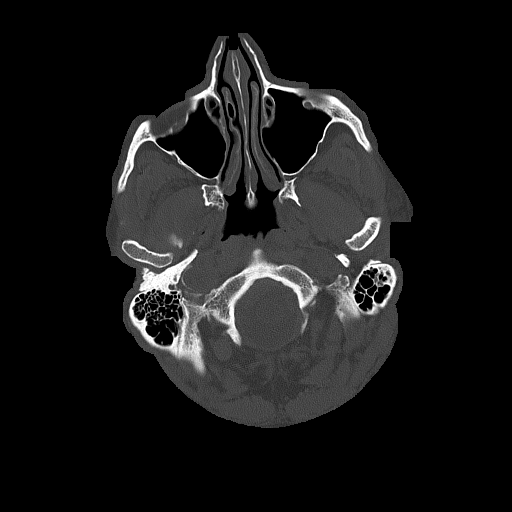
[im 18/89  bone]
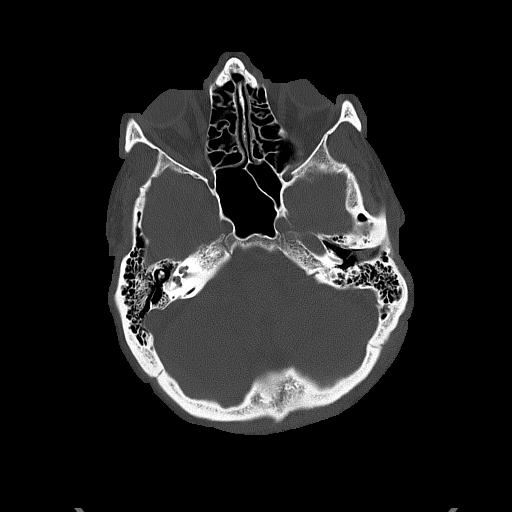
[im 27/89  bone]
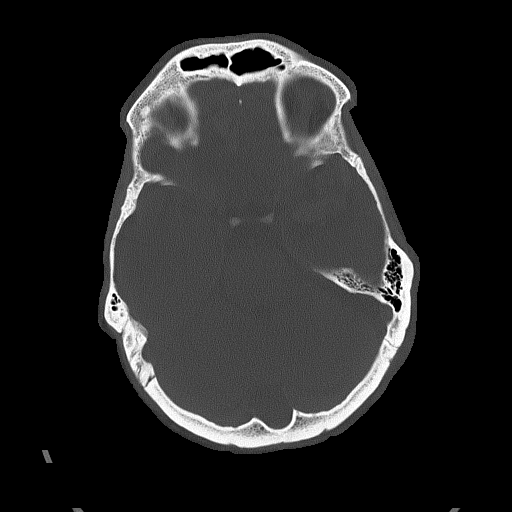

[Series 5: head without cor · coronal · non-contrast · 0.34mm/px · 3 of 77 slices shown]
[im 26/77  brain]
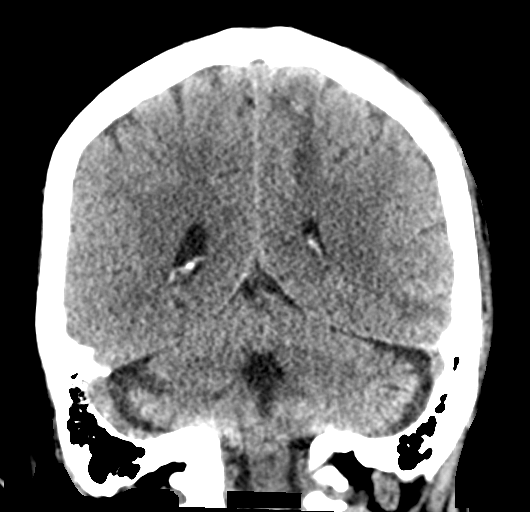
[im 34/77  brain]
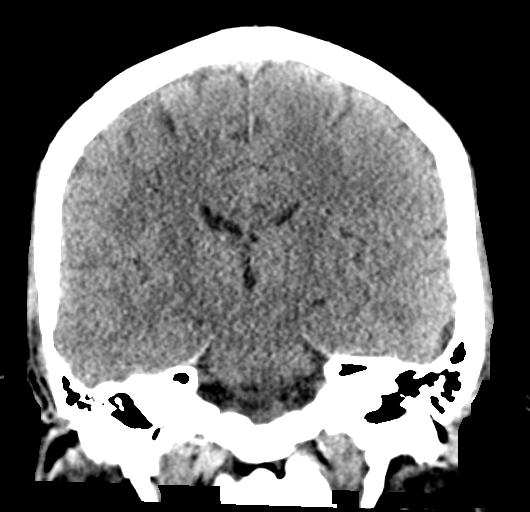
[im 43/77  brain]
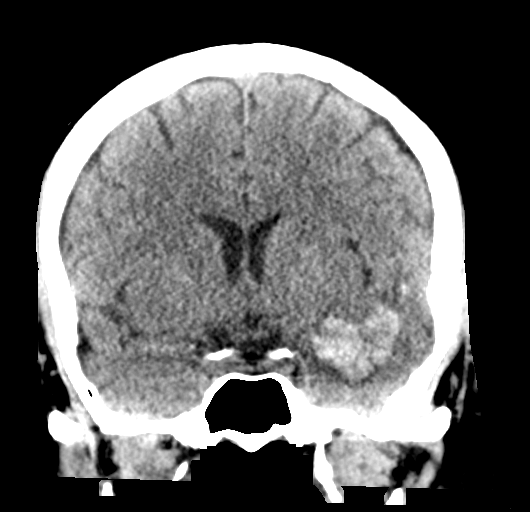

[Series 6: head without sag · sagittal · non-contrast · 0.32mm/px · 3 of 62 slices shown]
[im 21/62  brain]
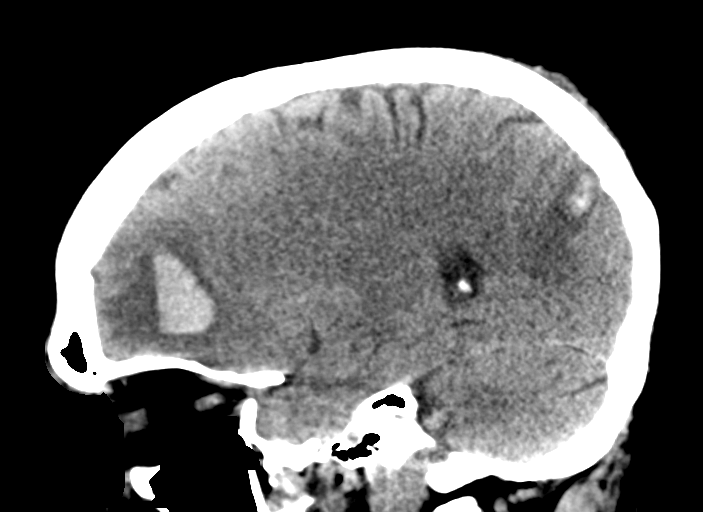
[im 31/62  brain]
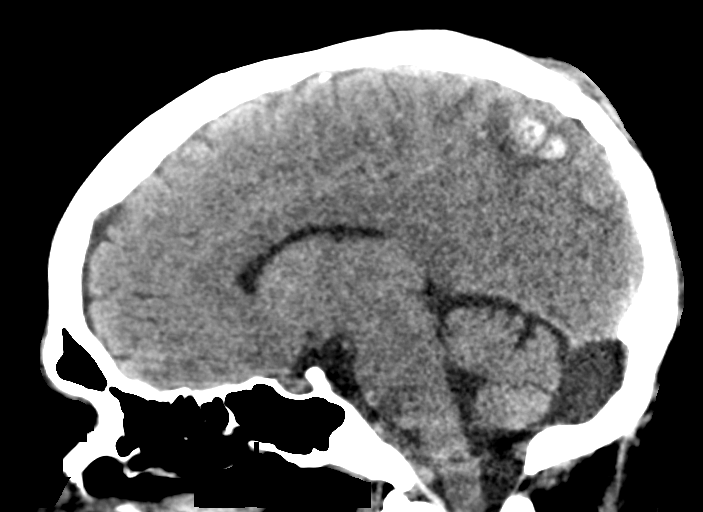
[im 41/62  brain]
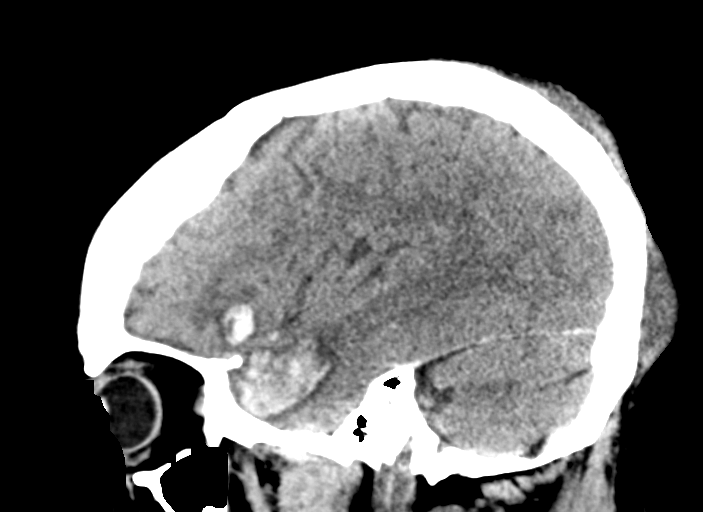

[16 of 47 positions shown; findings below may reference images not displayed]

FINDINGS: Brain: No significant change since 2 days ago. Previous close head
injury with initial trauma to the left posterior parietal region.
Numerous shear injuries with intraparenchymal hemorrhage affecting
both frontal regions, the left temporal tip, the inferolateral left
temporal lobe, and both parieto-occipital regions. None of these
hemorrhages appears to be enlarging at this point. Largest single
hematoma is in the right frontal lobe measuring up to 4.1 cm. Amount
of surrounding edema is unchanged. Very tiny amount of subdural
blood along the left parietal convexity is becoming less visible. No
more than 2 mm in thickness. No midline shift. No hydrocephalus. No
evidence of ischemic infarction.

Vascular: No abnormal vascular finding.

Skull: No skull fracture.

Sinuses/Orbits: No fluid in the sinuses.  Orbits negative.

Other: None
IMPRESSION: No significant change since 2 days ago. Numerous shear injuries with
intraparenchymal hemorrhage affecting both frontal regions, the left
temporal tip, the inferolateral left temporal lobe, and both
parieto-occipital regions. None of these hemorrhages appears to be
enlarging at this point. Amount of surrounding edema is unchanged.
Very tiny amount of subdural blood along the left parietal convexity
is becoming less visible, no more than 2 mm in thickness.

## 2020-06-23 MED ORDER — PREDNISOLONE SODIUM PHOSPHATE 15 MG/5ML PO SOLN
40.0000 mg | Freq: Every day | ORAL | Status: DC
  Administered 2020-06-23 – 2020-06-25 (×3): 40 mg via ORAL
  Filled 2020-06-23 (×3): qty 15

## 2020-06-23 MED ORDER — RIFAXIMIN 200 MG PO TABS
200.0000 mg | ORAL_TABLET | Freq: Two times a day (BID) | ORAL | Status: DC
  Administered 2020-06-23 – 2020-06-27 (×9): 200 mg via ORAL
  Filled 2020-06-23 (×11): qty 1

## 2020-06-23 MED ORDER — LACTULOSE 10 GM/15ML PO SOLN
20.0000 g | ORAL | Status: DC
  Administered 2020-06-23 – 2020-06-24 (×12): 20 g via ORAL
  Filled 2020-06-23 (×8): qty 30

## 2020-06-23 NOTE — Anesthesia Postprocedure Evaluation (Signed)
Anesthesia Post Note  Patient: Matthew Horn  Procedure(s) Performed: ESOPHAGOGASTRODUODENOSCOPY (EGD) WITH PROPOFOL (N/A )     Patient location during evaluation: Endoscopy Anesthesia Type: MAC Level of consciousness: awake and alert Pain management: pain level controlled Vital Signs Assessment: post-procedure vital signs reviewed and stable Respiratory status: spontaneous breathing, nonlabored ventilation, respiratory function stable and patient connected to nasal cannula oxygen Cardiovascular status: blood pressure returned to baseline and stable Postop Assessment: no apparent nausea or vomiting Anesthetic complications: no   No complications documented.  Last Vitals:  Vitals:   06/23/20 1800 06/23/20 1900  BP: (!) 123/94 (!) 130/101  Pulse: (!) 58 62  Resp: 18 (!) 21  Temp:    SpO2: 96% 96%    Last Pain:  Vitals:   06/23/20 1600  TempSrc: Oral  PainSc: 0-No pain                 Tye Juarez S

## 2020-06-23 NOTE — Progress Notes (Signed)
NAME:  Matthew Horn, MRN:  [id number], DOB:  12-09-1980, LOS: 3 ADMISSION DATE:  06/20/2020, CONSULTATION DATE: 830 10/2019 REFERRING MD: [facility] ED, CHIEF COMPLAINT: ICH  HPI/course in hospital  39 year old man with significant alcohol history.  Witnessed generalized tonic-clonic seizure with loss of consciousness while exiting restaurant today.  Brought here by EMS.  3 seizures en route which terminated with Versed 5 mg  Found to have a large superficial skull laceration on the right occiput which was stapled.  CT scan shows 2 small areas of intraparenchymal hemorrhage.  Patient remains confused and is not able to provide reliable corroborating history.  He apparently is from out of town.   Per brother  Past Medical History  No past medical history on file.  Patient is not able to provide reliable history.  Review of Systems:   Unable to obtain due to AMS  Social History    Unknown Family History   His family history is not on file.   Allergies No Known Allergies   Home Medications  Prior to Admission medications   Not on File     Interim history/subjective:   O/N pt became agitated, received two doses of Versed. The night team also increased his Precedex due to agitation as well.   This AM, he is drowsy but with sternal rub he awakens and answers question. Speech is difficult to understand but patient is able to answer basic questions.   Objective   Blood pressure 130/85, pulse 66, temperature 98.7 F (37.1 C), temperature source Oral, resp. rate 18, weight 108.4 kg, SpO2 99 %.        Intake/Output Summary (Last 24 hours) at 06/23/2020 0926 Last data filed at 06/23/2020 0800 Gross per 24 hour  Intake 2642.39 ml  Output 1775 ml  Net 867.39 ml   Filed Weights   06/20/20 1400  Weight: 108.4 kg    Examination: General: poorly groomed, chronically ill appearing HEENT: Scleral icterus present, cervical collar in place CV: RRR Lungs: CTAB Abd: soft, no  stigmata of chronic liver disease; increased distention from previous abdominal exams Skin: Jaundice present Neuro: confused and drowsy, oriented to self only, able to move all extremities and follow simple commands Ancillary tests (personally reviewed)  CBC: Recent Labs  Lab 06/20/20 1343 06/20/20 1343 06/21/20 0100 06/21/20 0100 06/21/20 1046 06/21/20 1745 06/21/20 2041 06/22/20 0353 06/23/20 0824  WBC 11.8*   < > 2.3*   < > 2.8* 3.9* 3.8* 3.5* 5.2  NEUTROABS 5.2  --  1.4*  --   --   --   --  2.5 PENDING  HGB 8.6*   < > 4.5*   < > 5.6* 8.4* 8.6* 8.3* 8.2*  HCT 30.5*   < > 14.5*   < > 18.2* 26.5* 26.5* 26.0* 25.9*  MCV 84.7   < > 76.3*   < > 80.2 82.0 82.6 83.3 84.1  PLT 41*   < > 35*   < > 37* 41* 41* 45* 72*   < > = values in this interval not displayed.    Basic Metabolic Panel: Recent Labs  Lab 06/20/20 1343 06/21/20 0100 06/22/20 0353  NA 135 135 137  K 3.6 3.5 4.4  CL 101 103 107  CO2 9* 21* 21*  GLUCOSE 105* 100* 130*  BUN 6 <5* 9  CREATININE 1.14 0.77 0.93  CALCIUM 8.0* 7.1* 7.2*  MG  --  1.6* 1.7  PHOS  --  3.6 4.4   GFR: CrCl cannot  be calculated (Unknown ideal weight.). Recent Labs  Lab 06/21/20 1745 06/21/20 2041 06/22/20 0353 06/23/20 0824  WBC 3.9* 3.8* 3.5* 5.2    Liver Function Tests: Recent Labs  Lab 06/20/20 1343 06/21/20 0100 06/22/20 0353  AST 156* 104*  102* 95*  ALT 27 28  24 24   ALKPHOS 179* 119  120 110  BILITOT 6.2* 5.5*  5.2* 5.5*  PROT 7.2 5.1*  5.0* 5.5*  ALBUMIN 3.1* 2.3*  2.2* 2.4*   No results for input(s): LIPASE, AMYLASE in the last 168 hours. Recent Labs  Lab 06/21/20 1745  AMMONIA 40*    ABG No results found for: PHART, PCO2ART, PO2ART, HCO3, TCO2, ACIDBASEDEF, O2SAT   Coagulation Profile: Recent Labs  Lab 06/20/20 1432 06/21/20 0100 06/22/20 0353 06/23/20 0824  INR 1.7* 1.6* 1.4* 1.5*    Cardiac Enzymes: No results for input(s): CKTOTAL, CKMB, CKMBINDEX, TROPONINI in the last 168  hours.  HbA1C: No results found for: HGBA1C  CBG: Recent Labs  Lab 06/22/20 1559 06/22/20 1916 06/22/20 2311 06/23/20 0338 06/23/20 0741  GLUCAP 130* 194* 138* 126* 110*   Antibiotics: Ceftriaxone  9/1 >  Studies / Tests: CT head 9/1 > worsening multifocal intraparenchymal hemorrhage with some new foci noted in the occipital and posterior parietal lobes without any midline shift or mass-effect EEG 8/31-9/1 > generalized continuous slowing, generalized excessive beta activity consistent with severe diffuse encephalopathy without any evidence of epileptiform discharges or seizures CT abdomen 8/31 > no evidence of portal vein thrombosis, severe portal venous hypertension with splenomegaly and splenic and gastric varices.  Cirrhosis with a distended gallbladder without cholecystitis or cholelithiasis, nonspecific hepatic flexure colonic wall thickening CT abdomen 9/1 > No retroperitoneal bleeding, new tiny bilateral pleural effusions, otherwise unchanged from previous exam EGD 9/2 > duodenal ulcers without active bleeding, grade 1 small esophageal varices, evidence for esophagitis and portal gastropathy  Cultures: Blood 9/1 >> no growth at 48h Urine 9/1 >> 100,000 colonies staph epi.   Assessment & Plan:  Critically ill due to traumatic intraparenchymal hemorrhage following fall related to witnessed seizure. Mild confusion with no focal deficits. Mechanism and pattern of injury suggest patient at risk for further decompensation as cerebral contusions involved. ICH stable on repeat CT from 9/1. - Repeat CT head - Continue frequent neurological monitoring - Keep blood pressure systolic less than 160.  Alcoholic Hepatitis with Liver Cirrhosis Jaundiced with thrombocytopenia. CT Abd suggestive of alcoholic cirrhosis. MELD-NA score of 21. Hepatitis panel negative. Patient tolerating oral meds, no BMs on TID lactulose. Platelet count and INR improving.  - Prednisolone 40 mg PO QD -  Lactulose 20g q2h - Rifaximin 200 mg BID - Ceftriaxone 1g IV QD for SBP prophylaxis initiated on 9/1 - Monitor coagulopathy  Acute Drop in Hg concerning for large active bleed most likely 2/2 duodenal ulcers ICH would not explain such a large drop in Hgb (8.6->4.5). No signs of retroperitoneal hemorrhage on Abdominal CT yesterday but significant varices noted. Hx chronic GI bleed per pt brother. Repeat abd CT from 9/1 negative for retroperitoneal bleed EGD on 9/2 showed duodenal ulcers, no active bleeding varices Patient is s/p 3 u pRBCs and Hgb has been stable since.  - Protonix 40 mg PO BID - Avoid NSAIDs  Recent seizure in the setting of alcohol withdrawal Per pts mother, pt had been trying to wean himself off alcohol prior to his seizure-like event. No signs of seizure activity while in CCU. EEG on 9/1 showed no seizure activity. Given cortical  bleeds noted on CT scan, will continue seizure prophylaxis at this time.  -Seizure precautions -Keppra for seizure prevention - Currently on Precedex, no Ativan at this time - Will attempt to wean Precedex more aggressively today to evaluate mental status more clearly  History of alcoholism at risk for withdrawal. - CIWA protocol, on Precedex as noted above   Leukopenia, improving  Acute drop of WBCs to 2.8 in the presence of alcohol use. WBC now wnl. Blood cultures negative at 48h.  - CBC Daily  Daily Goals Checklist  Pain/Anxiety/Delirium protocol (if indicated): CIWA only Neuro vitals: every 1 hours AED's: Keppra VAP protocol (if indicated): Not intubated Respiratory support goals: Titrate oxygen to sat greater than 92% Blood pressure target: Keep systolic blood pressure less than 160 DVT prophylaxis: SCDs only Nutrition Status: Clear liquids GI prophylaxis: Protonix Fluid status goals: Allow autoregulation Urinary catheter: External catheter only Central lines: PIV Glucose control: Monitor Mobility/therapy needs: Bedrest, needs  restraints to prevent self-harm Antibiotic de-escalation: ceftriaxone for SBP prophylaxis (5d course) Home medication reconciliation: None known Daily labs: CBC, CMP daily Code Status: Full code Family Communication: Mother/brother at bedside Disposition: ICU   Anette Riedel, MS4

## 2020-06-23 NOTE — Progress Notes (Addendum)
Daily Rounding Note  06/23/2020, 10:44 AM  LOS: 3 days   SUBJECTIVE:   Chief complaint: anemia.  New dx cirrhosis.  AMS post head trauma, CHI  Mom is now here and says pt was taking Ibuprofen for back pain. Pt more alert but still somnolent, denies recent hematemesis or CGE. No stools yet so dose of Miralax upped this AM.  Pt denies black stools Plan is to get pt to etoh rehaband establish medical care in [place] when he is released from hospital.   OBJECTIVE:         Vital signs in last 24 hours:    Temp:  [98.3 F (36.8 C)-99.4 F (37.4 C)] 98.7 F (37.1 C) (09/03 0800) Pulse Rate:  [51-73] 66 (09/03 0800) Resp:  [13-18] 18 (09/03 0800) BP: (118-145)/(65-122) 130/85 (09/03 0800) SpO2:  [97 %-99 %] 99 % (09/03 0800) Last BM Date:  (pta) Filed Weights   06/20/20 1400  Weight: 108.4 kg   General: still jaundiced, ill looking, more alert, extubated   Heart: RRR Chest: clear bil.  No dyspnea Abdomen: soft, slightly distended.  BS hypoactive  Extremities: non-pitting mild UE and LE edema Neuro/Psych:   Limited speech, just mumbling a few words.  Follows simple commands.  Swallowing w/o issue  Intake/Output from previous day: 09/02 0701 - 09/03 0700 In: 3009.3 [P.O.:600; I.V.:1987.1; IV Piggyback:422.3] Out: 1475 [Urine:1475]  Intake/Output this shift: Total I/O In: 71.8 [I.V.:71.8] Out: 300 [Urine:300]  Lab Results: Recent Labs    06/21/20 2041 06/22/20 0353 06/23/20 0824  WBC 3.8* 3.5* 5.2  HGB 8.6* 8.3* 8.2*  HCT 26.5* 26.0* 25.9*  PLT 41* 45* 72*   BMET Recent Labs    06/21/20 0100 06/22/20 0353 06/23/20 0824  NA 135 137 137  K 3.5 4.4 3.9  CL 103 107 106  CO2 21* 21* 22  GLUCOSE 100* 130* 107*  BUN <5* 9 8  CREATININE 0.77 0.93 0.96  CALCIUM 7.1* 7.2* 7.5*   LFT Recent Labs    06/21/20 0100 06/22/20 0353 06/23/20 0824  PROT 5.1*  5.0* 5.5* 6.0*  ALBUMIN 2.3*  2.2* 2.4*  2.4*  AST 104*  102* 95* 84*  ALT 28  24 24 23   ALKPHOS 119  120 110 104  BILITOT 5.5*  5.2* 5.5* 4.9*  BILIDIR 2.6*  --   --   IBILI 2.9*  --   --    PT/INR Recent Labs    06/22/20 0353 06/23/20 0824  LABPROT 16.7* 17.1*  INR 1.4* 1.5*   Hepatitis Panel Recent Labs    06/21/20 0100  HEPBSAG NON REACTIVE  NON REACTIVE  HCVAB NON REACTIVE  HEPAIGM NON REACTIVE  HEPBIGM NON REACTIVE    Studies/Results: CT ABDOMEN PELVIS WO CONTRAST  Result Date: 06/21/2020 CLINICAL DATA:  Evaluate for retroperitoneal hematoma.  Follow-up. EXAM: CT ABDOMEN AND PELVIS WITHOUT CONTRAST TECHNIQUE: Multidetector CT imaging of the abdomen and pelvis was performed following the standard protocol without IV contrast. COMPARISON:  06/20/2020 FINDINGS: Lower chest: Bibasilar subsegmental atelectasis. Normal heart size. Tiny bilateral pleural effusions, new. Decreased density within the intravascular space, suggesting anemia. Hepatobiliary: Marked cirrhosis and heterogeneous hepatic steatosis. Gallbladder wall thickening again identified. No calcified stone or biliary duct dilatation. Pancreas: Normal, without mass or ductal dilatation. Spleen: Splenomegaly at 14.1 cm craniocaudal. Adrenals/Urinary Tract: Normal adrenal glands. Contrast within renal collecting systems from yesterday's CT. A lower pole left sided calyceal diverticulum on 53/4. Contrast within the  urinary bladder. Stomach/Bowel: Normal stomach, without wall thickening. Ascending colonic wall thickening again identified, including on 57/4. Normal terminal ileum and appendix. Normal small bowel. Vascular/Lymphatic: Normal caliber of the aorta and branch vessels. Portal venous hypertension, with extensive perisplenic collaterals. No abdominopelvic adenopathy. Reproductive: Normal prostate. Other: Trace perihepatic ascites is unchanged. No retroperitoneal fluid or hemorrhage. Trace pelvic fluid, slightly decreased. Musculoskeletal: Mild bilateral  gynecomastia. Bilateral L5 pars defects. Significantly age advanced lumbar spondylosis. IMPRESSION: 1. No evidence of retroperitoneal fluid or hemorrhage. 2. Cirrhosis and portal venous hypertension. 3. Similar trace perihepatic and slightly decreased pelvic ascites. 4. Similar ascending colonic wall thickening. Most likely secondary to portal venous hypertension and venous congestion. Infectious colitis cannot be excluded. 5. New tiny bilateral pleural effusions. 6. Mild bilateral gynecomastia. 7. Contrast within the intravascular space, suggesting anemia. 8. Lower pole left renal calyceal diverticulum. Electronically Signed   By: Jeronimo Greaves M.D.   On: 06/21/2020 12:24   CT HEAD WO CONTRAST  Result Date: 06/23/2020 CLINICAL DATA:  Follow-up intracranial hemorrhage. EXAM: CT HEAD WITHOUT CONTRAST TECHNIQUE: Contiguous axial images were obtained from the base of the skull through the vertex without intravenous contrast. COMPARISON:  Multiple recent exams, initially 06/20/2020. Most recent 06/21/2020. FINDINGS: Brain: No significant change since 2 days ago. Previous close head injury with initial trauma to the left posterior parietal region. Numerous shear injuries with intraparenchymal hemorrhage affecting both frontal regions, the left temporal tip, the inferolateral left temporal lobe, and both parieto-occipital regions. None of these hemorrhages appears to be enlarging at this point. Largest single hematoma is in the right frontal lobe measuring up to 4.1 cm. Amount of surrounding edema is unchanged. Very tiny amount of subdural blood along the left parietal convexity is becoming less visible. No more than 2 mm in thickness. No midline shift. No hydrocephalus. No evidence of ischemic infarction. Vascular: No abnormal vascular finding. Skull: No skull fracture. Sinuses/Orbits: No fluid in the sinuses.  Orbits negative. Other: None IMPRESSION: No significant change since 2 days ago. Numerous shear injuries with  intraparenchymal hemorrhage affecting both frontal regions, the left temporal tip, the inferolateral left temporal lobe, and both parieto-occipital regions. None of these hemorrhages appears to be enlarging at this point. Amount of surrounding edema is unchanged. Very tiny amount of subdural blood along the left parietal convexity is becoming less visible, no more than 2 mm in thickness. Electronically Signed   By: Paulina Fusi M.D.   On: 06/23/2020 10:00   CT HEAD WO CONTRAST  Result Date: 06/21/2020 CLINICAL DATA:  Parenchymal hemorrhage, follow-up. EXAM: CT HEAD WITHOUT CONTRAST TECHNIQUE: Contiguous axial images were obtained from the base of the skull through the vertex without intravenous contrast. COMPARISON:  Head CT performed earlier the same day 06/21/2020, brain MRI 06/21/2020. FINDINGS: Brain: Numerous acute/early subacute parenchymal hemorrhages within the bilateral cerebral hemispheres have not significantly changed in size as compared to the head CT performed earlier the same day 06/21/2020. However, edema surrounding several hemorrhages may be subtly increased. As before, the largest parenchymal hemorrhages are present within the anterior right frontal lobe, within the anterior left temporal lobe and adjacent anterolateral left frontal lobe and within the right parietooccipital lobe. As before, there is regional mass effect associated with the hemorrhages but no midline shift or herniation. Vascular: No hyperdense vessel. Skull: Normal. Negative for fracture or focal lesion. Sinuses/Orbits: Visualized orbits show no acute finding. No significant paranasal sinus disease or mastoid effusion. Other: Redemonstrated large left posterior scalp hematoma with overlying scalp  staples. IMPRESSION: Numerous acute/early subacute parenchymal hemorrhages within bilateral cerebral hemispheres have not significantly changed in size as compared to the head CT performed earlier the same day 06/21/2020. However,  edema surrounding several of the hemorrhages may be subtly increased. As before, there is regional mass effect associated with the hemorrhages, but no midline shift or herniation. Redemonstrated large posterior left scalp hematoma with overlying scalp staples. Electronically Signed   By: Jackey Loge DO   On: 06/21/2020 12:31   DG Chest Port 1 View  Result Date: 06/22/2020 CLINICAL DATA:  Respiratory failure.  Hypoxia. EXAM: PORTABLE CHEST 1 VIEW COMPARISON:  06/21/2020. FINDINGS: Mediastinum and hilar structures normal. Stable cardiomegaly. No pulmonary venous congestion. Low lung volumes with bibasilar atelectasis again noted. No pleural effusion or pneumothorax. IMPRESSION: 1.  Stable cardiomegaly.  No pulmonary venous congestion. 2. Low lung volumes with bibasilar atelectasis again noted. Similar findings on prior exam. Electronically Signed   By: Maisie Fus  Register   On: 06/22/2020 05:48   DG Chest Port 1 View  Result Date: 06/21/2020 CLINICAL DATA:  Acute respiratory failure with hypoxia EXAM: PORTABLE CHEST 1 VIEW COMPARISON:  Portable exam 1110 hours without priors for comparison FINDINGS: Upper normal heart size. Mediastinal contours and pulmonary vascularity normal. Decreased lung volumes with mild RIGHT basilar atelectasis. No infiltrate, pleural effusion or pneumothorax. Osseous structures unremarkable. IMPRESSION: Decreased lung volumes with RIGHT basilar atelectasis. Electronically Signed   By: Ulyses Southward M.D.   On: 06/21/2020 11:39    ASSESMENT:   *    Anemia No overt GI bleeding or evidence for active GI bleeding on EGD.  Significant bleeding from his scalp wound described in admitting notes, could this have been the source?  Baseline Hgb unknown Hgb4.5 >> 3 PRBCs >> 8.2.  Hgb stable following the transfusions. No anemia profile obtained. EGD 9/2: Mild, LA grade B, nonbleeding esophagitis.  Grade 1, small esophageal varices without stigmata of bleeding.  Portal hypertensive gastropathy,  not actively bleeding.  Clean-based, nonbleeding duodenal ulcers.  Retained, nonbloody gastric fluid of greater than 1 L suctioned during procedure.  Pathology not obtained.  *    Thrombocytopenia.  S/p 2 packs Platelets 35 >> 70 2K.  *   Coagulopathy.  INR 1.7 >> 2 FFP >> 1.5.  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  Decompensated w ETOH hepatitis.  Acute hepatitis serologies, HCV quant nonreactive. Hep B surface Ab is 34.4 s/o Vaccinated.   Discriminant function 33, MELD 20. day 3 prednisolone. LFTs improved   *    Witnessed seizures leading to fall and associated head trauma, scalp laceration/contusion and intraparenchymal hemorrhage.   PLAN   *   Serum H Pylori Ab.  Needs abx/PPI eradication if positive.   *   7 d Lille score due 9/9.  Score >0.45 c/w non-responder to steroid and should stop Prednisolone.  If score < 0.45, complete a 28 d course prednisolone.     *  Protonix 40 mg po bid for 1 month, then drop to 40 mg/day.  *   Stop Ibuprofen.    *   ? Need for low dose nadolol or other non-selective BB, to reduce risk of variceal bleed and progression?   *   Advance to regular diet when mental status reliable for solid po/shewing.     *   Establish ongoing hepatology and general medical care in [place] where he is headed after discharge.   Lifelong ETOH abstinence, etoh rehab  is excellent plan.    *   GI signing off.       Jennye MoccasinSarah Gribbin  06/23/2020, 10:44 AM Phone [phone]     Attending Physician Note   I have taken an interval history, reviewed the chart and examined the patient. I agree with the Advanced Practitioner's note, impression and recommendations.   * Severe anemia without overt GI bleeding noted. ABL from scalp wound. Hgb stable post transfusions.    * Alcoholic hepatitis. Strictly avoid alcohol and alcohol rehab, AA as outpatient. Prednisolone for 28 days with parameters to stop earlier as outlined above. Follow up with GI/hepatologist in [place]  as outpatient.    * Cirrhosis with ascites, small esophageal varices, portal gastropathy, coagulopathy. Strictly avoid alcohol usage. Consider nadolol or similar as outpatient, after recovery from alcoholic hepatitis. Follow up with PCP and a GI/hepatologist in [place].  * Erosive esophagitis and duodenal ulcers. Avoid NSAIDs. Check H pylori Ab and treat if positive. Pantoprazole 40 mg po bid x 1 month and 40 mg po qd long term. Follow up with PCP and GI on return to [place].  * All above discussed in detail with the patients mother at bedside. GI signing off.     Claudette HeadMalcolm Kaleyah Labreck, MD [facility]  Gastroenterology

## 2020-06-24 LAB — COMPREHENSIVE METABOLIC PANEL
ALT: 27 U/L (ref 0–44)
AST: 105 U/L — ABNORMAL HIGH (ref 15–41)
Albumin: 2.5 g/dL — ABNORMAL LOW (ref 3.5–5.0)
Alkaline Phosphatase: 126 U/L (ref 38–126)
Anion gap: 8 (ref 5–15)
BUN: 7 mg/dL (ref 6–20)
CO2: 22 mmol/L (ref 22–32)
Calcium: 7.8 mg/dL — ABNORMAL LOW (ref 8.9–10.3)
Chloride: 104 mmol/L (ref 98–111)
Creatinine, Ser: 0.78 mg/dL (ref 0.61–1.24)
GFR calc Af Amer: >60 mL/min (ref >60)
GFR calc non Af Amer: >60 mL/min (ref >60)
Glucose, Bld: 114 mg/dL — ABNORMAL HIGH (ref 70–99)
Potassium: 3.7 mmol/L (ref 3.5–5.1)
Sodium: 134 mmol/L — ABNORMAL LOW (ref 135–145)
Total Bilirubin: 4.7 mg/dL — ABNORMAL HIGH (ref 0.3–1.2)
Total Protein: 5.8 g/dL — ABNORMAL LOW (ref 6.5–8.1)

## 2020-06-24 LAB — CBC WITH DIFFERENTIAL/PLATELET
Abs Immature Granulocytes: 0.03 10*3/uL (ref 0.00–0.07)
Basophils Absolute: 0 10*3/uL (ref 0.0–0.1)
Basophils Relative: 0 %
Eosinophils Absolute: 0 10*3/uL (ref 0.0–0.5)
Eosinophils Relative: 1 %
HCT: 29.1 % — ABNORMAL LOW (ref 39.0–52.0)
Hemoglobin: 8.8 g/dL — ABNORMAL LOW (ref 13.0–17.0)
Immature Granulocytes: 1 %
Lymphocytes Relative: 28 %
Lymphs Abs: 1.3 10*3/uL (ref 0.7–4.0)
MCH: 26 pg (ref 26.0–34.0)
MCHC: 30.2 g/dL (ref 30.0–36.0)
MCV: 86.1 fL (ref 80.0–100.0)
Monocytes Absolute: 0.7 10*3/uL (ref 0.1–1.0)
Monocytes Relative: 16 %
Neutro Abs: 2.5 10*3/uL (ref 1.7–7.7)
Neutrophils Relative %: 54 %
Platelets: 66 10*3/uL — ABNORMAL LOW (ref 150–400)
RBC: 3.38 MIL/uL — ABNORMAL LOW (ref 4.22–5.81)
RDW: 22 % — ABNORMAL HIGH (ref 11.5–15.5)
WBC: 4.6 10*3/uL (ref 4.0–10.5)
nRBC: 0.4 % — ABNORMAL HIGH (ref 0.0–0.2)

## 2020-06-24 LAB — PHOSPHORUS: Phosphorus: 3.2 mg/dL (ref 2.5–4.6)

## 2020-06-24 LAB — PROTIME-INR
INR: 1.4 — ABNORMAL HIGH (ref 0.8–1.2)
Prothrombin Time: 16.7 seconds — ABNORMAL HIGH (ref 11.4–15.2)

## 2020-06-24 LAB — MAGNESIUM: Magnesium: 1.6 mg/dL — ABNORMAL LOW (ref 1.7–2.4)

## 2020-06-24 LAB — GLUCOSE, CAPILLARY
Glucose-Capillary: 130 mg/dL — ABNORMAL HIGH (ref 70–99)
Glucose-Capillary: 106 mg/dL — ABNORMAL HIGH (ref 70–99)

## 2020-06-24 MED ORDER — MAGNESIUM SULFATE 4 GM/100ML IV SOLN
4.0000 g | Freq: Once | INTRAVENOUS | Status: AC
  Administered 2020-06-24: 4 g via INTRAVENOUS
  Filled 2020-06-24: qty 100

## 2020-06-24 MED ORDER — ACETAMINOPHEN 325 MG PO TABS
650.0000 mg | ORAL_TABLET | Freq: Four times a day (QID) | ORAL | Status: DC | PRN
  Administered 2020-06-24: 650 mg via ORAL
  Filled 2020-06-24 (×2): qty 2

## 2020-06-24 MED ORDER — ACETAMINOPHEN 325 MG PO TABS
650.0000 mg | ORAL_TABLET | ORAL | Status: DC | PRN
  Administered 2020-06-24: 650 mg via ORAL

## 2020-06-24 MED ORDER — LACTULOSE 10 GM/15ML PO SOLN
20.0000 g | Freq: Three times a day (TID) | ORAL | Status: DC
  Administered 2020-06-24 – 2020-06-27 (×8): 20 g via ORAL
  Filled 2020-06-24 (×8): qty 30

## 2020-06-24 NOTE — Progress Notes (Signed)
eLink Physician-Brief Progress Note Patient Name: Matthew Horn DOB: 1981-04-25 MRN: [id number]   Date of Service  06/24/2020  HPI/Events of Note  Patient with sub-optimal pain relief on current prn dose of Tylenol Q 6 hours. Based on LFT's patient can go to Q 4 hours Tylenol prn pain.   eICU Interventions  Tylenol interval changed to Q 4 hours prn.        Thomasene Lot Matthew Horn 06/24/2020, 10:40 PM

## 2020-06-24 NOTE — Progress Notes (Signed)
Pharmacy Electrolyte Replacement  Recent Labs:  Recent Labs    06/24/20 0616  K 3.7  MG 1.6*  PHOS 3.2  CREATININE 0.78    Low Critical Values (K </= 2.5, Phos </= 1, Mg </= 1) Present: None  MD Contacted: Chand  Plan:  - Mg 4g IV x 1 - F/u Mg with AM labs  Thank you for allowing pharmacy to be a part of this patient's care.  Georgina Pillion, PharmD, BCPS Clinical Pharmacist Clinical phone for 06/24/2020: [phone] 06/24/2020 12:41 PM   **Pharmacist phone directory can now be found on [email or web address] ([email or web address]).  Listed under [facility].

## 2020-06-24 NOTE — Progress Notes (Signed)
NAME:  Matthew Horn, MRN:  [id number], DOB:  25-Jun-1981, LOS: 4 ADMISSION DATE:  06/20/2020, CONSULTATION DATE: 830 10/2019 REFERRING MD: [facility], CHIEF COMPLAINT: ICH  HPI/course in hospital  39 year old man with significant alcohol history.  Witnessed generalized tonic-clonic seizure with loss of consciousness while exiting restaurant today.  Brought here by EMS.  3 seizures en route which terminated with Versed 5 mg  Found to have a large superficial skull laceration on the right occiput which was stapled.  CT scan shows 2 small areas of intraparenchymal hemorrhage.  Patient remains confused and is not able to provide reliable corroborating history.  He apparently is from out of town.   Per brother  Past Medical History  No past medical history on file.  Patient is not able to provide reliable history.  Review of Systems:   Unable to obtain due to AMS  Social History    Unknown Family History   His family history is not on file.   Allergies No Known Allergies   Home Medications  Prior to Admission medications   Not on File     Interim history/subjective:  Overnight patient became agitated, required high-dose Precedex infusion and wrist restraints.  Now he is calmer  Objective   Blood pressure 116/85, pulse (!) 56, temperature (!) 97.5 F (36.4 C), temperature source Oral, resp. rate 16, weight 108.4 kg, SpO2 100 %.        Intake/Output Summary (Last 24 hours) at 06/24/2020 1114 Last data filed at 06/24/2020 1100 Gross per 24 hour  Intake 2568.23 ml  Output 1900 ml  Net 668.23 ml   Filed Weights   06/20/20 1400  Weight: 108.4 kg    Examination:   Physical exam: General: Chronically ill-appearing male, with hard neck collar on HEAENT: [place]/AT, eyes icteric Neuro: Alert, awake but disoriented.  Following simple commands.  Strength and sensation is equal bilaterally Chest: Coarse breath sounds, no wheezes or rhonchi Heart: Regular rate and rhythm, no  murmurs or gallops Abdomen: Soft, nontender, nondistended, bowel sounds present Skin: Jaundiced  Ancillary tests (personally reviewed)  CBC: Recent Labs  Lab 06/20/20 1343 06/20/20 1343 06/21/20 0100 06/21/20 1046 06/21/20 1745 06/21/20 2041 06/22/20 0353 06/23/20 0824 06/24/20 0616  WBC 11.8*   < > 2.3*   < > 3.9* 3.8* 3.5* 5.2 4.6  NEUTROABS 5.2  --  1.4*  --   --   --  2.5 3.1 2.5  HGB 8.6*   < > 4.5*   < > 8.4* 8.6* 8.3* 8.2* 8.8*  HCT 30.5*   < > 14.5*   < > 26.5* 26.5* 26.0* 25.9* 29.1*  MCV 84.7   < > 76.3*   < > 82.0 82.6 83.3 84.1 86.1  PLT 41*   < > 35*   < > 41* 41* 45* 72* 66*   < > = values in this interval not displayed.    Basic Metabolic Panel: Recent Labs  Lab 06/20/20 1343 06/21/20 0100 06/22/20 0353 06/23/20 0824 06/24/20 0616  NA 135 135 137 137 134*  K 3.6 3.5 4.4 3.9 3.7  CL 101 103 107 106 104  CO2 9* 21* 21* 22 22  GLUCOSE 105* 100* 130* 107* 114*  BUN 6 <5* 9 8 7   CREATININE 1.14 0.77 0.93 0.96 0.78  CALCIUM 8.0* 7.1* 7.2* 7.5* 7.8*  MG  --  1.6* 1.7 1.7 1.6*  PHOS  --  3.6 4.4 2.9 3.2   GFR: CrCl cannot be calculated (Unknown ideal  weight.). Recent Labs  Lab 06/21/20 2041 06/22/20 0353 06/23/20 0824 06/24/20 0616  WBC 3.8* 3.5* 5.2 4.6    Liver Function Tests: Recent Labs  Lab 06/20/20 1343 06/21/20 0100 06/22/20 0353 06/23/20 0824 06/24/20 0616  AST 156* 104*  102* 95* 84* 105*  ALT 27 28  24 24 23 27   ALKPHOS 179* 119  120 110 104 126  BILITOT 6.2* 5.5*  5.2* 5.5* 4.9* 4.7*  PROT 7.2 5.1*  5.0* 5.5* 6.0* 5.8*  ALBUMIN 3.1* 2.3*  2.2* 2.4* 2.4* 2.5*   No results for input(s): LIPASE, AMYLASE in the last 168 hours. Recent Labs  Lab 06/21/20 1745  AMMONIA 40*    ABG No results found for: PHART, PCO2ART, PO2ART, HCO3, TCO2, ACIDBASEDEF, O2SAT   Coagulation Profile: Recent Labs  Lab 06/20/20 1432 06/21/20 0100 06/22/20 0353 06/23/20 0824 06/24/20 0616  INR 1.7* 1.6* 1.4* 1.5* 1.4*    Cardiac  Enzymes: No results for input(s): CKTOTAL, CKMB, CKMBINDEX, TROPONINI in the last 168 hours.  HbA1C: No results found for: HGBA1C  CBG: Recent Labs  Lab 06/23/20 1647 06/23/20 1933 06/23/20 2315 06/24/20 0310 06/24/20 0738  GLUCAP 139* 145* 121* 130* 106*   Antibiotics: Ceftriaxone  9/1 >  Studies / Tests: CT head 9/1 > worsening multifocal intraparenchymal hemorrhage with some new foci noted in the occipital and posterior parietal lobes without any midline shift or mass-effect EEG 8/31-9/1 > generalized continuous slowing, generalized excessive beta activity consistent with severe diffuse encephalopathy without any evidence of epileptiform discharges or seizures CT abdomen 8/31 > no evidence of portal vein thrombosis, severe portal venous hypertension with splenomegaly and splenic and gastric varices.  Cirrhosis with a distended gallbladder without cholecystitis or cholelithiasis, nonspecific hepatic flexure colonic wall thickening CT abdomen 9/1 > No retroperitoneal bleeding, new tiny bilateral pleural effusions, otherwise unchanged from previous exam EGD 9/2 > duodenal ulcers without active bleeding, grade 1 small esophageal varices, evidence for esophagitis and portal gastropathy  Cultures: Blood 9/1 >> no growth at 48h Urine 9/1 >> 100,000 colonies staph epi.   Assessment & Plan:  Traumatic, multifocal intraparenchymal and subarachnoid hemorrhage status post fall related to witnessed generalized tonic-clonic seizure.  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 Hyponatremia  Patient remains disoriented but without focal deficit Repeat CT head yesterday showed stability of intraparenchymal hemorrhage with surrounding edema Continue Keppra for now SBP goal <160 Continue neuro watch MELD-NA score  of 31 Patient's hemoglobin and platelet counts are stable now Continue tapered dose prednisone for alcoholic hepatitis Continue lactulose 20 g every 2 hour until 2 bowel movement then will decrease the dose/frequency Continue IV ceftriaxone for SBP prophylaxis day 4 of 5 Monitor chemistry Continue Protonix twice daily for gastric ulcer Avoid NSAIDs  Daily Goals Checklist  Pain/Anxiety/Delirium protocol (if indicated): CIWA only Neuro vitals: every 1 hours AED's: Keppra VAP protocol (if indicated): Not intubated Respiratory support goals: Titrate oxygen to sat greater than 92% Blood pressure target: Keep systolic blood pressure less than 160 DVT prophylaxis: SCDs only Nutrition Status: Clear liquids GI prophylaxis: Protonix Fluid status goals: Allow autoregulation Urinary catheter: External catheter only Central lines: PIV Glucose control: Monitor Mobility/therapy needs: Bedrest, needs restraints to prevent self-harm Antibiotic de-escalation: ceftriaxone for SBP prophylaxis (5d course) Home medication reconciliation: None known Daily labs: CBC, CMP daily Code Status: Full code Family Communication: Mother/brother at bedside Disposition:  ICU    Total critical care time: 41 minutes  Performed by: Cheri Fowler   Critical care time was exclusive of separately billable procedures and treating other patients.   Critical care was necessary to treat or prevent imminent or life-threatening deterioration.   Critical care was time spent personally by me on the following activities: development of treatment plan with patient and/or surrogate as well as nursing, discussions with consultants, evaluation of patient's response to treatment, examination of patient, obtaining history from patient or surrogate, ordering and performing treatments and interventions, ordering and review of laboratory studies, ordering and review of radiographic studies, pulse oximetry and re-evaluation of patient's  condition.   Cheri Fowler MD Critical care physician [facility]  Critical Care  Pager: [phone] Mobile: [phone]

## 2020-06-25 ENCOUNTER — Encounter (HOSPITAL_COMMUNITY): Payer: Self-pay | Admitting: Gastroenterology

## 2020-06-25 LAB — MAGNESIUM: Magnesium: 1.8 mg/dL (ref 1.7–2.4)

## 2020-06-25 MED ORDER — LIDOCAINE 4 % EX CREA
TOPICAL_CREAM | Freq: Three times a day (TID) | CUTANEOUS | Status: DC | PRN
  Administered 2020-06-27: 1 via TOPICAL
  Filled 2020-06-25 (×3): qty 5

## 2020-06-25 MED ORDER — MAGNESIUM SULFATE 4 GM/100ML IV SOLN
4.0000 g | Freq: Once | INTRAVENOUS | Status: AC
  Administered 2020-06-25: 4 g via INTRAVENOUS
  Filled 2020-06-25: qty 100

## 2020-06-25 MED ORDER — PREDNISONE 20 MG PO TABS
40.0000 mg | ORAL_TABLET | Freq: Every day | ORAL | Status: DC
  Administered 2020-06-26 – 2020-06-30 (×5): 40 mg via ORAL
  Filled 2020-06-25 (×5): qty 2

## 2020-06-25 MED ORDER — ACETAMINOPHEN 500 MG PO TABS
500.0000 mg | ORAL_TABLET | ORAL | Status: DC | PRN
  Administered 2020-06-25: 500 mg via ORAL
  Filled 2020-06-25: qty 1

## 2020-06-25 NOTE — Progress Notes (Signed)
NAME:  Matthew Horn, MRN:  [id number], DOB:  [birth year], LOS: 5 ADMISSION DATE:  06/20/2020, CONSULTATION DATE: 830 10/2019 REFERRING MD: [facility] ED, CHIEF COMPLAINT: ICH  HPI/course in hospital  39 year old man with significant alcohol history.  Witnessed generalized tonic-clonic seizure with loss of consciousness while exiting restaurant today.  Brought here by EMS.  3 seizures en route which terminated with Versed 5 mg  Found to have a large superficial skull laceration on the right occiput which was stapled.  CT scan shows 2 small areas of intraparenchymal hemorrhage.  Patient remains confused and is not able to provide reliable corroborating history.  He apparently is from out of town.   Past Medical History  No past medical history on file.  Patient is not able to provide reliable history.  Review of Systems:   Complaining of headache  Social History    Unknown Family History   His family history is not on file.   Allergies No Known Allergies   Home Medications  Prior to Admission medications   Not on File     Interim history/subjective:  Patient is much more alert, awake and following commands.  Complaining of mild headache  Objective   Blood pressure 133/79, pulse 88, temperature 98.6 F (37 C), temperature source Oral, resp. rate 16, weight 108.4 kg, SpO2 100 %.        Intake/Output Summary (Last 24 hours) at 06/25/2020 1251 Last data filed at 06/25/2020 1000 Gross per 24 hour  Intake 1387.08 ml  Output 2475 ml  Net -1087.92 ml   Filed Weights   06/20/20 1400  Weight: 108.4 kg    Examination:   Physical exam: General: Chronically ill-appearing male, lying on the bed HEAENT: [place]/AT, eyes icteric Neuro: Alert awake and oriented, following commands.  Strength and sensation is equal bilaterally Chest: Clear to auscultation bilaterally, no wheezes or rhonchi Heart: Regular rate and rhythm, no murmurs or gallops Abdomen: Soft, nontender, nondistended,  bowel sounds present Skin: Jaundiced  Ancillary tests (personally reviewed)  CBC: Recent Labs  Lab 06/20/20 1343 06/20/20 1343 06/21/20 0100 06/21/20 1046 06/21/20 1745 06/21/20 2041 06/22/20 0353 06/23/20 0824 06/24/20 0616  WBC 11.8*   < > 2.3*   < > 3.9* 3.8* 3.5* 5.2 4.6  NEUTROABS 5.2  --  1.4*  --   --   --  2.5 3.1 2.5  HGB 8.6*   < > 4.5*   < > 8.4* 8.6* 8.3* 8.2* 8.8*  HCT 30.5*   < > 14.5*   < > 26.5* 26.5* 26.0* 25.9* 29.1*  MCV 84.7   < > 76.3*   < > 82.0 82.6 83.3 84.1 86.1  PLT 41*   < > 35*   < > 41* 41* 45* 72* 66*   < > = values in this interval not displayed.    Basic Metabolic Panel: Recent Labs  Lab 06/20/20 1343 06/21/20 0100 06/22/20 0353 06/23/20 0824 06/24/20 0616 06/25/20 0448  NA 135 135 137 137 134*  --   K 3.6 3.5 4.4 3.9 3.7  --   CL 101 103 107 106 104  --   CO2 9* 21* 21* 22 22  --   GLUCOSE 105* 100* 130* 107* 114*  --   BUN 6 <5* 9 8 7   --   CREATININE 1.14 0.77 0.93 0.96 0.78  --   CALCIUM 8.0* 7.1* 7.2* 7.5* 7.8*  --   MG  --  1.6* 1.7 1.7 1.6* 1.8  PHOS  --  3.6 4.4 2.9 3.2  --    GFR: CrCl cannot be calculated (Unknown ideal weight.). Recent Labs  Lab 06/21/20 2041 06/22/20 0353 06/23/20 0824 06/24/20 0616  WBC 3.8* 3.5* 5.2 4.6    Liver Function Tests: Recent Labs  Lab 06/20/20 1343 06/21/20 0100 06/22/20 0353 06/23/20 0824 06/24/20 0616  AST 156* 104*  102* 95* 84* 105*  ALT 27 28  24 24 23 27   ALKPHOS 179* 119  120 110 104 126  BILITOT 6.2* 5.5*  5.2* 5.5* 4.9* 4.7*  PROT 7.2 5.1*  5.0* 5.5* 6.0* 5.8*  ALBUMIN 3.1* 2.3*  2.2* 2.4* 2.4* 2.5*   No results for input(s): LIPASE, AMYLASE in the last 168 hours. Recent Labs  Lab 06/21/20 1745  AMMONIA 40*    ABG No results found for: PHART, PCO2ART, PO2ART, HCO3, TCO2, ACIDBASEDEF, O2SAT   Coagulation Profile: Recent Labs  Lab 06/20/20 1432 06/21/20 0100 06/22/20 0353 06/23/20 0824 06/24/20 0616  INR 1.7* 1.6* 1.4* 1.5* 1.4*    Cardiac  Enzymes: No results for input(s): CKTOTAL, CKMB, CKMBINDEX, TROPONINI in the last 168 hours.  HbA1C: No results found for: HGBA1C  CBG: Recent Labs  Lab 06/23/20 1647 06/23/20 1933 06/23/20 2315 06/24/20 0310 06/24/20 0738  GLUCAP 139* 145* 121* 130* 106*   Antibiotics: Ceftriaxone  9/1 >  Studies / Tests: CT head 9/1 > worsening multifocal intraparenchymal hemorrhage with some new foci noted in the occipital and posterior parietal lobes without any midline shift or mass-effect EEG 8/31-9/1 > generalized continuous slowing, generalized excessive beta activity consistent with severe diffuse encephalopathy without any evidence of epileptiform discharges or seizures CT abdomen 8/31 > no evidence of portal vein thrombosis, severe portal venous hypertension with splenomegaly and splenic and gastric varices.  Cirrhosis with a distended gallbladder without cholecystitis or cholelithiasis, nonspecific hepatic flexure colonic wall thickening CT abdomen 9/1 > No retroperitoneal bleeding, new tiny bilateral pleural effusions, otherwise unchanged from previous exam EGD 9/2 > duodenal ulcers without active bleeding, grade 1 small esophageal varices, evidence for esophagitis and portal gastropathy  Cultures: Blood 9/1 >> no growth at 48h Urine 9/1 >> 100,000 colonies staph epi.   Assessment & Plan:  Traumatic, multifocal intraparenchymal and subarachnoid hemorrhage status post fall related to witnessed generalized tonic-clonic seizure.  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 Grade 1 esophageal varices Hyponatremia Hypomagnesemia  Patient is much more alert awake and oriented today He denies neck pain, so hard neck collar was discontinued His neurological exam remained stable, nonfocal Continue Keppra for  now SBP goal <160 MELD-NA score of 31 Patient's hemoglobin and platelet counts are stable now Continue prednisone for alcoholic hepatitis to complete 28 days in total Patient had multiple bowel movements yesterday, since then his mental status is improved Decrease lactulose to 20 g every 8 hour, continue rifaximin Continue IV ceftriaxone for SBP prophylaxis day 5 of 5 Monitor chemistry Continue Protonix twice daily for gastric ulcer Avoid NSAIDs Outpatient follow-up with GI/hepatology  Daily Goals Checklist  Pain/Anxiety/Delirium protocol (if indicated): N/A AED's: Keppra VAP protocol (if indicated): N/A DVT prophylaxis: SCDs only Nutrition Status: Clear liquids per speech and swallow GI prophylaxis: Protonix Urinary catheter: External catheter only Glucose control: Monitor Code Status: Full code Family Communication: Patient girlfriend is at bedside, updated about his condition Disposition: Transfer to medical floor   11/1 MD Critical care physician  [facility]  Pager: [phone] Mobile: [phone]

## 2020-06-26 DIAGNOSIS — W19XXXA Unspecified fall, initial encounter: Secondary | ICD-10-CM

## 2020-06-26 DIAGNOSIS — G9341 Metabolic encephalopathy: Secondary | ICD-10-CM

## 2020-06-26 DIAGNOSIS — D72829 Elevated white blood cell count, unspecified: Secondary | ICD-10-CM

## 2020-06-26 DIAGNOSIS — S06360A Traumatic hemorrhage of cerebrum, unspecified, without loss of consciousness, initial encounter: Secondary | ICD-10-CM

## 2020-06-26 LAB — CBC
HCT: 29 % — ABNORMAL LOW (ref 39.0–52.0)
Hemoglobin: 9.2 g/dL — ABNORMAL LOW (ref 13.0–17.0)
MCH: 26.8 pg (ref 26.0–34.0)
MCHC: 31.7 g/dL (ref 30.0–36.0)
MCV: 84.5 fL (ref 80.0–100.0)
Platelets: 144 10*3/uL — ABNORMAL LOW (ref 150–400)
RBC: 3.43 MIL/uL — ABNORMAL LOW (ref 4.22–5.81)
RDW: 21.9 % — ABNORMAL HIGH (ref 11.5–15.5)
WBC: 6.7 10*3/uL (ref 4.0–10.5)
nRBC: 0 % (ref 0.0–0.2)

## 2020-06-26 LAB — CULTURE, BLOOD (ROUTINE X 2)
Culture: NO GROWTH
Culture: NO GROWTH
Special Requests: ADEQUATE

## 2020-06-26 LAB — COMPREHENSIVE METABOLIC PANEL
ALT: 38 U/L (ref 0–44)
AST: 129 U/L — ABNORMAL HIGH (ref 15–41)
Albumin: 2.8 g/dL — ABNORMAL LOW (ref 3.5–5.0)
Alkaline Phosphatase: 128 U/L — ABNORMAL HIGH (ref 38–126)
Anion gap: 10 (ref 5–15)
BUN: 5 mg/dL — ABNORMAL LOW (ref 6–20)
CO2: 22 mmol/L (ref 22–32)
Calcium: 7.9 mg/dL — ABNORMAL LOW (ref 8.9–10.3)
Chloride: 102 mmol/L (ref 98–111)
Creatinine, Ser: 0.98 mg/dL (ref 0.61–1.24)
GFR calc Af Amer: >60 mL/min (ref >60)
GFR calc non Af Amer: >60 mL/min (ref >60)
Glucose, Bld: 108 mg/dL — ABNORMAL HIGH (ref 70–99)
Potassium: 3 mmol/L — ABNORMAL LOW (ref 3.5–5.1)
Sodium: 134 mmol/L — ABNORMAL LOW (ref 135–145)
Total Bilirubin: 4.9 mg/dL — ABNORMAL HIGH (ref 0.3–1.2)
Total Protein: 6.4 g/dL — ABNORMAL LOW (ref 6.5–8.1)

## 2020-06-26 LAB — PROTIME-INR
INR: 1.5 — ABNORMAL HIGH (ref 0.8–1.2)
Prothrombin Time: 17.2 seconds — ABNORMAL HIGH (ref 11.4–15.2)

## 2020-06-26 LAB — AMMONIA: Ammonia: 26 umol/L (ref 9–35)

## 2020-06-26 MED ORDER — POTASSIUM CHLORIDE CRYS ER 20 MEQ PO TBCR
20.0000 meq | EXTENDED_RELEASE_TABLET | Freq: Two times a day (BID) | ORAL | Status: AC
  Administered 2020-06-26 – 2020-06-27 (×4): 20 meq via ORAL
  Filled 2020-06-26 (×4): qty 1

## 2020-06-26 MED ORDER — LACTULOSE 10 GM/15ML PO SOLN
20.0000 g | Freq: Three times a day (TID) | ORAL | 0 refills | Status: DC
Start: 2020-06-26 — End: 2020-06-30

## 2020-06-26 MED ORDER — PREDNISOLONE 5 MG PO TABS: 40.0000 mg | ORAL_TABLET | Freq: Every day | ORAL | 0 refills | Status: DC

## 2020-06-26 MED ORDER — RIFAXIMIN 200 MG PO TABS
200.0000 mg | ORAL_TABLET | Freq: Two times a day (BID) | ORAL | 0 refills | Status: DC
Start: 2020-06-26 — End: 2020-06-30

## 2020-06-26 MED ORDER — THIAMINE MONONITRATE 100 MG PO TABS: 100.0000 mg | ORAL_TABLET | Freq: Every day | ORAL | 0 refills | Status: AC

## 2020-06-26 MED ORDER — LEVETIRACETAM 500 MG PO TABS: 500.0000 mg | ORAL_TABLET | Freq: Two times a day (BID) | ORAL | 0 refills | Status: AC

## 2020-06-26 MED ORDER — PANTOPRAZOLE SODIUM 40 MG PO TBEC: 40.0000 mg | DELAYED_RELEASE_TABLET | Freq: Two times a day (BID) | ORAL | 0 refills | Status: AC

## 2020-06-26 NOTE — Social Work (Signed)
CSW acknowledging consult for medication assistance; TOC team will assist with medications at d/c. Please send scripts to [facility].  Octavio Graves, MSW, LCSW [facility] Clinical Social Work

## 2020-06-26 NOTE — Progress Notes (Addendum)
Patient ID: Matthew Horn, male   DOB: 06-Jan-1981, 39 y.o.   MRN: [id number]  PROGRESS NOTE    Chanoch Mccleery  IFO:[id number] DOB: [birth year] DOA: 06/20/2020 PCP: Patient, No Pcp Per    Brief Narrative:  Patient is a 39 year old gentleman with longstanding history of alcohol use.  He is initially from [facility] and was here working.  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  There was a scalp laceration was repaired in the ED and he was noted to have intracranial hemorrhages with ongoing seizures.  He was started on Keppra by neurology and EEG was performed that showed gross metabolic encephalopathy.  He was placed in the ICU where CIWA protocol was initiated he was on Precedex and was quite confused.  He had large drop in hemoglobin and required transfusions.  His intracranial bleeding was stable and did not require any acute intervention.  He did undergo EGD while there which showed mild esophageal varices.  He was started on lactulose and rifaximin and his ammonia levels came down when he awoke and is much less confused than he was previously.  He was transferred to the floor and TRH service.   Assessment & Plan:   Active Problems:   Intracerebral hemorrhage (HCC)   Alcohol withdrawal seizure with complication (HCC)   Alcoholic hepatitis   Cirrhosis (HCC)   Thrombocytopenia (HCC)   Duodenal ulcer   Acute blood loss anemia  Traumatic intraparenchymal hemorrhage following fall related to witnessed seizure. Mild confusion with no focal deficits. ICH stable on repeat CT from 9/1. - Continue frequent neurological monitoring -Consider longer-term therapies PT OT speech in the setting of TBI. - Keep blood pressure systolic less than 160. -Okay for staple removal now that were 7 days post emergent laceration repair  Alcoholic Hepatitis with Liver Cirrhosis Jaundiced with  thrombocytopenia and coagulopathy. CT Abd suggestive of alcoholic cirrhosis. MELD-NA score of 21. Hepatitis panel negative  - Prednisolone 40 mg PO QD x28 days followed by taper. - Lactulose 20g PO TID - Ceftriaxone 1g IV QD for SBP prophylaxis initiated on 9/1 -complete -Rifaximin 200 mg twice daily - Monitor coagulopathy -currently INR is 1.5. -LFTs down to 129/38, T bili down to 4.9 -Possible need for nonselective beta-blocker to reduce the risk for variceal bleeding -Establish with hepatology once arrives in [place].  Acute Drop in Hg following trauma with large loss that time of presentation as well as esophageal varices and ulcer noted. ICH would not explain such a large drop in Hgb (8.6->4.5). No signs of retroperitoneal hemorrhage on Abdominal CT yesterday but significant varices noted. Hx chronic GI bleed per pt brother. EGD on 9/2 showed duodenal ulcers, no active bleeding varices Patient is s/p 3 u pRBCs and Hgb has been stable since.  - Protonix 40 mg PO BID x30 days then p.o. daily -H pylori antibodies are pending, should these be positive he will require antibiotics. - Avoid NSAIDs  Recent seizure in the setting of alcohol withdrawal Per pts mother, pt had been trying to wean himself off alcohol prior to his seizure-like event. No signs of seizure activity while in CCU. EEG on 9/1 showed no seizure activity.  -Seizure precautions -Keppra for seizure prevention -Per neurology no driving x 6 months -Establish care with neurology once arrives in [place]  History of alcoholism at risk for withdrawal. Lengthy discussion with mom and  patient today around strategies for stopping alcohol use, supportive network such as AA, dangers of continued alcohol use given significant liver dysfunction.  Lifelong abstinence is recommended - CIWA protocol. - Precedex stopped at this time but will leave on PRN in favor of Ativan at this time  Leukopenia Acute drop of WBCs to 2.8 in  the presence of alcohol use  Metabolic encephalopathy Related to liver dysfunction and ammonia level of 40 down to 26 today -We will continue lactulose and rifaximin -Thiamine 100 mg daily  Thrombocytopenia Likely related to cirrhosis and splenic enlargement.  Improved today up to 144   DVT prophylaxis: None:Active bleeding, coagulopathy in the setting of liver failure Code Status: Full code  Family Communication: Patient and mom at bedside Disposition Plan: Pending PT eval.  Mom is having to drive him across country back to [place].  She does need to apply for emergency Medicaid as patient is uninsured.  She will also need to help arrange follow-up wherever he lands with neurology and possible therapies for TBI.  Patient remains inpatient due to unsafe discharge plan.  Consultants:   Neurology  Neurosurgery  Gastroenterology  Pulmonary critical care medicine  Procedures:  EEG  EGD  2 units of packed red blood cells  2 units of platelets  2 units of FFP   Studies / Tests: CT head 9/1>worsening multifocal intraparenchymal hemorrhage with some new foci noted in the occipital and posterior parietal lobes without any midline shift or mass-effect EEG 8/31-9/1>generalized continuous slowing, generalized excessive beta activity consistent with severe diffuse encephalopathy without any evidence of epileptiform discharges or seizures CT abdomen 8/31 >no evidence of portal vein thrombosis, severe portal venous hypertension with splenomegaly and splenic and gastric varices. Cirrhosis with a distended gallbladder without cholecystitis or cholelithiasis, nonspecific hepatic flexure colonic wall thickening CT abdomen 9/1 > No retroperitoneal bleeding, new tiny bilateral pleural effusions, otherwise unchanged from previous exam EGD 9/2>duodenal ulcers without active bleeding, grade 1 small esophageal varices, evidence for esophagitis and portal  gastropathy  Antimicrobials: Anti-infectives (From admission, onward)   Start     Dose/Rate Route Frequency Ordered Stop   06/23/20 1030  rifaximin (XIFAXAN) tablet 200 mg        200 mg Oral 2 times daily 06/23/20 0908     06/21/20 1315  cefTRIAXone (ROCEPHIN) 1 g in sodium chloride 0.9 % 100 mL IVPB        1 g 200 mL/hr over 30 Minutes Intravenous Every 24 hours 06/21/20 1314 06/25/20 1443       Subjective: Awake and alert today.  Patient wants to shower.  He would like to advance his diet which seems reasonable to me.  Objective: Vitals:   06/25/20 1839 06/25/20 1949 06/26/20 0142 06/26/20 0459  BP: (!) 143/88 140/86 (!) 141/87 (!) 142/85  Pulse: 70 75 74 75  Resp: Temp: 99.1 F (37.3 C) 99 F (37.2 C) 99.1 F (37.3 C) 99.1 F (37.3 C)  TempSrc: Oral Oral Oral Oral  SpO2: 100% 100% 100% 100%  Weight:        Intake/Output Summary (Last 24 hours) at 06/26/2020 1321 Last data filed at 06/26/2020 1147 Gross per 24 hour  Intake 500 ml  Output --  Net 500 ml   Filed Weights   06/20/20 1400  Weight: 108.4 kg    Examination:  General exam: Appears calm and comfortable  Respiratory system: Clear to auscultation. Respiratory effort normal. Cardiovascular system: S1 & S2 heard, RRR.  Gastrointestinal system: Abdomen is nondistended, soft and nontender.  Central nervous system: Alert and oriented. No focal neurological deficits.  Still mildly confused Extremities: Symmetric  Skin: No rashes, staples present in the head Psychiatry: Judgement and insight appear normal. Mood & affect appropriate.     Data Reviewed: I have personally reviewed following labs and imaging studies  CBC: Recent Labs  Lab 06/20/20 1343 06/20/20 1343 06/21/20 0100 06/21/20 1046 06/21/20 2041 06/22/20 0353 06/23/20 0824 06/24/20 0616 06/26/20 1054  WBC 11.8*   < > 2.3*   < > 3.8* 3.5* 5.2 4.6 6.7  NEUTROABS 5.2  --  1.4*  --   --  2.5 3.1 2.5  --   HGB 8.6*   < > 4.5*   <  > 8.6* 8.3* 8.2* 8.8* 9.2*  HCT 30.5*   < > 14.5*   < > 26.5* 26.0* 25.9* 29.1* 29.0*  MCV 84.7   < > 76.3*   < > 82.6 83.3 84.1 86.1 84.5  PLT 41*   < > 35*   < > 41* 45* 72* 66* 144*   < > = values in this interval not displayed.   Basic Metabolic Panel: Recent Labs  Lab 06/21/20 0100 06/22/20 0353 06/23/20 0824 06/24/20 0616 06/25/20 0448 06/26/20 1054  NA 135 137 137 134*  --  134*  K 3.5 4.4 3.9 3.7  --  3.0*  CL 103 107 106 104  --  102  CO2 21* 21* 22 22  --  22  GLUCOSE 100* 130* 107* 114*  --  108*  BUN <5* 9 8 7   --  5*  CREATININE 0.77 0.93 0.96 0.78  --  0.98  CALCIUM 7.1* 7.2* 7.5* 7.8*  --  7.9*  MG 1.6* 1.7 1.7 1.6* 1.8  --   PHOS 3.6 4.4 2.9 3.2  --   --    GFR: CrCl cannot be calculated (Unknown ideal weight.). Liver Function Tests: Recent Labs  Lab 06/21/20 0100 06/22/20 0353 06/23/20 0824 06/24/20 0616 06/26/20 1054  AST 104*  102* 95* 84* 105* 129*  ALT 28  24 24 23 27  38  ALKPHOS 119  120 110 104 126 128*  BILITOT 5.5*  5.2* 5.5* 4.9* 4.7* 4.9*  PROT 5.1*  5.0* 5.5* 6.0* 5.8* 6.4*  ALBUMIN 2.3*  2.2* 2.4* 2.4* 2.5* 2.8*    Recent Labs  Lab 06/21/20 1745 06/26/20 1054  AMMONIA 40* 26   Coagulation Profile: Recent Labs  Lab 06/21/20 0100 06/22/20 0353 06/23/20 0824 06/24/20 0616 06/26/20 1054  INR 1.6* 1.4* 1.5* 1.4* 1.5*   CBG: Recent Labs  Lab 06/23/20 1647 06/23/20 1933 06/23/20 2315 06/24/20 0310 06/24/20 0738  GLUCAP 139* 145* 121* 130* 106*     Recent Results (from the past 240 hour(s))  SARS Coronavirus 2 by RT PCR (hospital order, performed in [facility] hospital lab) Nasopharyngeal Nasopharyngeal Swab     Status: None   Collection Time: 06/20/20  1:44 PM   Specimen: Nasopharyngeal Swab  Result Value Ref Range Status   SARS Coronavirus 2 NEGATIVE NEGATIVE Final    Comment: (NOTE) SARS-CoV-2 target nucleic acids are NOT DETECTED.  The SARS-CoV-2 RNA is generally detectable in upper and lower respiratory  specimens during the acute phase of infection. The lowest concentration of SARS-CoV-2 viral copies this assay can detect is 250 copies / mL. A negative result does not preclude SARS-CoV-2 infection and should not be used as the sole basis for treatment or other patient management  decisions.  A negative result may occur with improper specimen collection / handling, submission of specimen other than nasopharyngeal swab, presence of viral mutation(s) within the areas targeted by this assay, and inadequate number of viral copies (<250 copies / mL). A negative result must be combined with clinical observations, patient history, and epidemiological information.  Fact Sheet for Patients:   BoilerBrush.com.cy  Fact Sheet for Healthcare Providers: https://pope.com/  This test is not yet approved or  cleared by the [place] FDA and has been authorized for detection and/or diagnosis of SARS-CoV-2 by FDA under an Emergency Use Authorization (EUA).  This EUA will remain in effect (meaning this test can be used) for the duration of the COVID-19 declaration under Section 564(b)(1) of the Act, 21 U.S.C. section 360bbb-3(b)(1), unless the authorization is terminated or revoked sooner.  Performed at [facility], [address] [place], [place] [address]   MRSA PCR Screening     Status: None   Collection Time: 06/20/20  7:10 PM   Specimen: Nasopharyngeal  Result Value Ref Range Status   MRSA by PCR NEGATIVE NEGATIVE Final    Comment:        The GeneXpert MRSA Assay (FDA approved for NASAL specimens only), is one component of a comprehensive MRSA colonization surveillance program. It is not intended to diagnose MRSA infection nor to guide or monitor treatment for MRSA infections. Performed at [facility], [address] [place], [place] [address]   Urine Culture     Status: Abnormal   Collection Time: 06/21/20  2:14 PM    Specimen: Urine, Catheterized  Result Value Ref Range Status   Specimen Description URINE, CATHETERIZED  Final   Special Requests   Final    NONE Performed at [facility], [address], [place], [place] [address]    Culture >=100,000 COLONIES/mL STAPHYLOCOCCUS EPIDERMIDIS (A)  Final   Report Status 06/23/2020 FINAL  Final   Organism ID, Bacteria STAPHYLOCOCCUS EPIDERMIDIS (A)  Final      Susceptibility   Staphylococcus epidermidis - MIC*    CIPROFLOXACIN <=0.5 SENSITIVE Sensitive     GENTAMICIN <=0.5 SENSITIVE Sensitive     NITROFURANTOIN <=16 SENSITIVE Sensitive     OXACILLIN RESISTANT Resistant     TETRACYCLINE <=1 SENSITIVE Sensitive     VANCOMYCIN 1 SENSITIVE Sensitive     TRIMETH/SULFA <=10 SENSITIVE Sensitive     CLINDAMYCIN <=0.25 SENSITIVE Sensitive     RIFAMPIN <=0.5 SENSITIVE Sensitive     Inducible Clindamycin NEGATIVE Sensitive     * >=100,000 COLONIES/mL STAPHYLOCOCCUS EPIDERMIDIS  Culture, blood (Routine X 2) w Reflex to ID Panel     Status: None   Collection Time: 06/21/20  5:45 PM   Specimen: BLOOD RIGHT HAND  Result Value Ref Range Status   Specimen Description BLOOD RIGHT HAND  Final   Special Requests   Final    BOTTLES DRAWN AEROBIC AND ANAEROBIC Blood Culture adequate volume   Culture   Final    NO GROWTH 5 DAYS Performed at [facility], [address] [place], [place] [address]    Report Status 06/26/2020 FINAL  Final  Culture, blood (Routine X 2) w Reflex to ID Panel     Status: None   Collection Time: 06/21/20  5:51 PM   Specimen: BLOOD LEFT HAND  Result Value Ref Range Status   Specimen Description BLOOD LEFT HAND  Final   Special Requests   Final    BOTTLES  DRAWN AEROBIC ONLY Blood Culture results may not be optimal due to an inadequate volume of blood received in culture bottles   Culture   Final    NO GROWTH 5 DAYS Performed at [facility], [address], [place], [place] [address]    Report Status 06/26/2020 FINAL   Final      Radiology Studies: No results found.   Scheduled Meds: . Chlorhexidine Gluconate Cloth  6 each Topical Daily  . lactulose  20 g Oral Q8H  . levETIRAcetam  500 mg Oral BID  . pantoprazole  40 mg Oral BID  . predniSONE  40 mg Oral Q breakfast  . rifaximin  200 mg Oral BID  . [START ON 06/29/2020] thiamine injection  100 mg Intravenous Daily   Continuous Infusions: . sodium chloride    . sodium chloride Stopped (06/22/20 0651)  . levETIRAcetam Stopped (06/23/20 2220)  . thiamine injection 250 mg (06/26/20 1101)     LOS: 6 days    Reva Bores, MD 06/26/2020 1:21 PM [phone] [facility] If 7PM-7AM, please contact night-coverage 06/26/2020, 1:21 PM

## 2020-06-26 NOTE — Evaluation (Signed)
Physical Therapy Evaluation Patient Details Name: Matthew Horn MRN: [id number] DOB: 1981/04/16 Today's Date: 06/26/2020   History of Present Illness  Pt is a 39 y/o male admitted after seizure. Pt fell and hit his head. Found to have shear injuries with intraparenchymal hemorrhage affecting both frontal regions, the left temporal tip, the inferolateral left temporal lobe, and both parieto-occipital regions. Pt also found to have anemia and received transfusion. Pt is s/p EGD that revealed erosive esophagitis and duodenal ulcers. Pt also with new alcoholic hepatitis. PMH includes alcohol abuse.   Clinical Impression  Pt admitted secondary to problem above with deficits below. Pt impulsive and requiring safety cues throughout. Pt also with short term memory deficits and repeating questions throughout. Pt requiring min guard for mobility tasks within the room. Required cues for safe speed. Per pt's mother, the plan is to return to [place] and pt will be living with her. She is able to provide 24/7 assist. Feel pt would benefit from outpatient neuro PT services if possible once he returns to [place]. Will continue to follow acutely to maximize functional mobility independence and safety.     Follow Up Recommendations Outpatient PT;Supervision/Assistance - 24 hour (neuro outpatient PT once he gets back to [place])    Equipment Recommendations  None recommended by PT    Recommendations for Other Services       Precautions / Restrictions Precautions Precautions: Fall Precaution Comments: Pt impulsive Restrictions Weight Bearing Restrictions: No      Mobility  Bed Mobility               General bed mobility comments: In bathroom upon entry   Transfers Overall transfer level: Needs assistance Equipment used: None Transfers: Sit to/from Stand Sit to Stand: Min guard         General transfer comment: Min guard for safety. Cues to wait for PT prior to standing.    Ambulation/Gait Ambulation/Gait assistance: Min guard Gait Distance (Feet): 40 Feet Assistive device: None Gait Pattern/deviations: Step-through pattern;Decreased stride length Gait velocity: Decreased   General Gait Details: Cues to slow gait speed to safe pace. Pt impulsive and had to have cues to wait for PT. Getting tangled in IV line at one point. No overt LOB noted.   Stairs            Wheelchair Mobility    Modified Rankin (Stroke Patients Only)       Balance Overall balance assessment: Needs assistance Sitting-balance support: No upper extremity supported;Feet supported Sitting balance-Leahy Scale: Good     Standing balance support: No upper extremity supported;During functional activity Standing balance-Leahy Scale: Fair                               Pertinent Vitals/Pain Pain Assessment: Faces Faces Pain Scale: Hurts a little bit Pain Location: head and tongue Pain Descriptors / Indicators: Sore Pain Intervention(s): Monitored during session;Limited activity within patient's tolerance;Repositioned    Home Living Family/patient expects to be discharged to:: Private residence Living Arrangements: Parent Available Help at Discharge: Family;Available 24 hours/day Type of Home: House Home Access: Level entry     Home Layout: One level Home Equipment: None Additional Comments: planning to stay in [place] with mom    Prior Function Level of Independence: Independent               Hand Dominance        Extremity/Trunk Assessment  Upper Extremity Assessment Upper Extremity Assessment: Defer to OT evaluation    Lower Extremity Assessment Lower Extremity Assessment: Overall WFL for tasks assessed    Cervical / Trunk Assessment Cervical / Trunk Assessment: Normal  Communication   Communication: No difficulties  Cognition Arousal/Alertness: Awake/alert Behavior During Therapy: Impulsive Overall Cognitive Status:  Impaired/Different from baseline Area of Impairment: Attention;Memory;Awareness;Safety/judgement                   Current Attention Level: Sustained Memory: Decreased short-term memory;Decreased recall of precautions   Safety/Judgement: Decreased awareness of deficits;Decreased awareness of safety Awareness: Emergent   General Comments: Pt impulsive and demonstrating short term memory deficits. Asking questions repeatedly. Also required safety cues to slow pace and wait for PT.       General Comments      Exercises     Assessment/Plan    PT Assessment Patient needs continued PT services  PT Problem List Decreased balance;Decreased mobility;Decreased cognition;Decreased knowledge of precautions;Decreased safety awareness       PT Treatment Interventions Gait training;Functional mobility training;Therapeutic activities;Therapeutic exercise;Balance training;Cognitive remediation;Patient/family education    PT Goals (Current goals can be found in the Care Plan section)  Acute Rehab PT Goals Patient Stated Goal: to go home with his mom  PT Goal Formulation: With patient Time For Goal Achievement: 07/10/20 Potential to Achieve Goals: Good    Frequency Min 3X/week   Barriers to discharge        Co-evaluation               AM-PAC PT "6 Clicks" Mobility  Outcome Measure Help needed turning from your back to your side while in a flat bed without using bedrails?: None Help needed moving from lying on your back to sitting on the side of a flat bed without using bedrails?: None Help needed moving to and from a bed to a chair (including a wheelchair)?: A Little Help needed standing up from a chair using your arms (e.g., wheelchair or bedside chair)?: A Little Help needed to walk in hospital room?: A Little Help needed climbing 3-5 steps with a railing? : A Little 6 Click Score: 20    End of Session Equipment Utilized During Treatment: Gait belt Activity  Tolerance: Patient tolerated treatment well Patient left: in chair;with call bell/phone within reach;with family/visitor present (getting ready to take shower) Nurse Communication: Mobility status PT Visit Diagnosis: Other abnormalities of gait and mobility (R26.89);Other symptoms and signs involving the nervous system (M19.622)    Time: 2979-8921 PT Time Calculation (min) (ACUTE ONLY): 21 min   Charges:   PT Evaluation $PT Eval Moderate Complexity: 1 Mod          Farley Ly, PT, DPT  Acute Rehabilitation Services  Pager: [phone] Office: [phone]   Lehman Prom 06/26/2020, 4:17 PM

## 2020-06-27 ENCOUNTER — Encounter (HOSPITAL_COMMUNITY): Payer: Self-pay | Admitting: Pulmonary Disease

## 2020-06-27 LAB — COMPREHENSIVE METABOLIC PANEL
ALT: 34 U/L (ref 0–44)
AST: 105 U/L — ABNORMAL HIGH (ref 15–41)
Albumin: 2.5 g/dL — ABNORMAL LOW (ref 3.5–5.0)
Alkaline Phosphatase: 122 U/L (ref 38–126)
Anion gap: 8 (ref 5–15)
BUN: 7 mg/dL (ref 6–20)
CO2: 22 mmol/L (ref 22–32)
Calcium: 7.9 mg/dL — ABNORMAL LOW (ref 8.9–10.3)
Chloride: 105 mmol/L (ref 98–111)
Creatinine, Ser: 0.84 mg/dL (ref 0.61–1.24)
GFR calc Af Amer: >60 mL/min (ref >60)
GFR calc non Af Amer: >60 mL/min (ref >60)
Glucose, Bld: 113 mg/dL — ABNORMAL HIGH (ref 70–99)
Potassium: 3.5 mmol/L (ref 3.5–5.1)
Sodium: 135 mmol/L (ref 135–145)
Total Bilirubin: 3.5 mg/dL — ABNORMAL HIGH (ref 0.3–1.2)
Total Protein: 5.7 g/dL — ABNORMAL LOW (ref 6.5–8.1)

## 2020-06-27 LAB — CBC
HCT: 26.8 % — ABNORMAL LOW (ref 39.0–52.0)
Hemoglobin: 8.5 g/dL — ABNORMAL LOW (ref 13.0–17.0)
MCH: 26.6 pg (ref 26.0–34.0)
MCHC: 31.7 g/dL (ref 30.0–36.0)
MCV: 83.8 fL (ref 80.0–100.0)
Platelets: 112 10*3/uL — ABNORMAL LOW (ref 150–400)
RBC: 3.2 MIL/uL — ABNORMAL LOW (ref 4.22–5.81)
RDW: 21.7 % — ABNORMAL HIGH (ref 11.5–15.5)
WBC: 5.1 10*3/uL (ref 4.0–10.5)
nRBC: 0 % (ref 0.0–0.2)

## 2020-06-27 LAB — PROTIME-INR
INR: 1.6 — ABNORMAL HIGH (ref 0.8–1.2)
Prothrombin Time: 18.1 seconds — ABNORMAL HIGH (ref 11.4–15.2)

## 2020-06-27 LAB — H PYLORI, IGM, IGG, IGA AB
H Pylori IgG: 0.53 Index Value (ref 0.00–0.79)
H. Pylogi, Iga Abs: <9 units (ref 0.0–8.9)
H. Pylogi, Igm Abs: <9 units (ref 0.0–8.9)

## 2020-06-27 LAB — AMMONIA: Ammonia: 37 umol/L — ABNORMAL HIGH (ref 9–35)

## 2020-06-27 MED ORDER — TAB-A-VITE/IRON PO TABS
1.0000 | ORAL_TABLET | Freq: Every day | ORAL | Status: DC
  Administered 2020-06-27 – 2020-06-29 (×3): 1 via ORAL
  Filled 2020-06-27 (×5): qty 1

## 2020-06-27 MED ORDER — LACTULOSE 10 GM/15ML PO SOLN
30.0000 g | Freq: Three times a day (TID) | ORAL | Status: DC
  Administered 2020-06-27 – 2020-06-29 (×6): 30 g via ORAL
  Filled 2020-06-27 (×6): qty 45

## 2020-06-27 MED ORDER — ENSURE ENLIVE PO LIQD
237.0000 mL | Freq: Two times a day (BID) | ORAL | Status: DC
  Administered 2020-06-28 – 2020-06-30 (×5): 237 mL via ORAL

## 2020-06-27 MED ORDER — LORAZEPAM 1 MG PO TABS
1.0000 mg | ORAL_TABLET | Freq: Two times a day (BID) | ORAL | Status: DC
  Administered 2020-06-27 – 2020-06-30 (×7): 1 mg via ORAL
  Filled 2020-06-27 (×8): qty 1

## 2020-06-27 MED ORDER — THIAMINE HCL 100 MG PO TABS
100.0000 mg | ORAL_TABLET | Freq: Every day | ORAL | Status: DC
  Administered 2020-06-28 – 2020-06-30 (×3): 100 mg via ORAL
  Filled 2020-06-27 (×3): qty 1

## 2020-06-27 MED FILL — levETIRAcetam 500 MG TABS: 500 | 30 days supply | Qty: 60 | Fill #0

## 2020-06-27 MED FILL — VITAMIN B-1 100 MG TABS: 100 | 30 days supply | Qty: 30 | Fill #0

## 2020-06-27 MED FILL — PANTOPRAZOLE SOD DR 40 MG T: 40 | 30 days supply | Qty: 60 | Fill #0

## 2020-06-27 NOTE — Progress Notes (Signed)
PROGRESS NOTE    Olof Marcil  JSE:[id number] DOB: 01-12-1981 DOA: 06/20/2020 PCP: Patient, No Pcp Per    Brief Narrative:  Patient admitted to the hospital with a working diagnosis of brain traumatic intraparenchymal hemorrhage following a fall, due to alcohol related seizure.  Complicated by acute alcoholic hepatitis and hepatic encephalopathy..  39 year old male with past medical history for alcohol abuse.  Patient was witnessed to have a generalized tonic-clonic seizure, while exiting a restaurant.  Patient had 3 seizures on route to the hospital (received Versed).  On his initial physical examination his blood pressure was 133/80, heart rate 138, temperature 99.5, respiratory rate of 14, oxygen saturation 100%.  He had a laceration on his right hand, positive scleral icterus, his lungs are clear to auscultation bilaterally, heart S1-S2, present rhythm, his abdomen was soft, Glasgow Coma Scale was 15, confused, nonfocal. Sodium 135, potassium 3.6, chloride 101, bicarb 9, glucose 105, BUN 6, creatinine 1.14, AST 156, ALT 27, white count 11.8, hemoglobin 8.6, hematocrit 30.5, platelets 41, INR 1.7.  SARS COVID-19 negative.  Urinalysis specific gravity 1.016, 100 protein, 0-5 red cells, 0-5 white cells.  Toxicology positive for alcohol 15, positive for benzodiazepines. Head CT with moderate volume acute intraparenchymal hemorrhage in the anterior aspect of the right frontal lobe extending local mass-effect.  No significant midline shift.  Additional small volume acute intraparenchymal hemorrhage in the anterior left temporal lobe and superior medial aspect of the right occipital lobe.  Small volume associated with subarachnoid hemorrhage.  Positive for motion artifact CT cervical spine, but no acute fracture.  Chest radiograph with right basal atelectasis. EKG 151 bpm, normal axis, normal intervals, sinus rhythm, no significant ST segment or T wave changes.  Patient was admitted to the intensive  care unit, he was placed on Keppra for seizure control.  CIWA protocol for alcohol withdrawal.  Patient required dexmedetomidine due to agitation.  He was diagnosed with acute alcoholic hepatitis with hepatic encephalopathy, was placed on lactulose and rifaximin.  Patient underwent upper endoscopy which showed mild esophageal varices.  Assessment & Plan:   Active Problems:   Intracerebral hemorrhage (HCC)   Alcohol withdrawal seizure with complication (HCC)   Alcoholic hepatitis   Cirrhosis (HCC)   Thrombocytopenia (HCC)   Duodenal ulcer   Acute blood loss anemia   1. Acute traumatic intracranial hemorrhage, right frontal lobe, left temporal lobe, right occipital lobe/ complicated with seizures. Patient continue to be disorientated. He is ambulating and non focal. Cognitive impairment likely from brain injury. Continue neuro checks per unit protocol.   Physical and occupational therapy have recommended home health services.   Continue with keppra for seizure prophylaxis.   2. Alcohol intoxication with seizures. Patient with mild tremors, no frank agitation. Will add bid lorazepam to prevent further withdrawal    3. Acute alcoholic hepatitis/ chronic alcoholic liver cirrhosis (esophageal varices)/ acute on chronic liver failure. AST 105. ALT is 34. INR 1.6. T Bil 3.5. Plt 112 Patient continue with asterixis and icterus.  Continue prednisone for a total of 28 days. Will need outpatient follow up. Ammonia has been trending down, but clinically continue with signs of encephalopathy, will increase lactulose to 30 mg tid to target 2 to 3 watery bowel movements per day.  Continue thiamine and multivitamins, will consult nutrition for recommendations.   Discontinue rifaximin, patient not able to afford this medication as outpatient.   4. Acute blood loss anemia due to upper GI bleed/ duodenal ulcer. Sp 2 units PRBC transfusion. Patient is  tolerating po well, continue antiacid therapy with  proton pump inhibition.  Hgb today at 8,5 and Hct at 26,8.   5. Thrombocytopenia sp  2 pool of platelet transfusion. Due to chronic liver failure. Platelet count today up to 112.   6. Hypokalemia. Due to GI losses, will continue K correction.   Patient continue to be at high risk for  Worsening encephalopathy   Status is: Inpatient  Remains inpatient appropriate because:Inpatient level of care appropriate due to severity of illness   Dispo: The patient is from: Home              Anticipated d/c is to: Home              Anticipated d/c date is: 2 days              Patient currently is not medically stable to d/c.   DVT prophylaxis: scd   Code Status:   full  Family Communication:  I spoke with patient's girlfriend (mother over the phone) at the bedside, we talked in detail about patient's condition, plan of care and prognosis and all questions were addressed.     Consultants:   Neurology  Neurosurgery  Gastroenterology  Pulmonary critical care medicine  Procedures:  EEG  EGD  2 units of packed red blood cells  2 units of platelets  2 units of FFP    Subjective: Patient continue to be confused and disorientated, no frank agitation at the time of my examination, all information from his family and nursing at the bedside   Objective: Vitals:   06/26/20 0459 06/26/20 1438 06/26/20 2044 06/27/20 0434  BP: (!) 142/85 134/76 (!) 148/86 116/90  Pulse: 75 97 80 73  Resp: 16 18 16 17   Temp: 99.1 F (37.3 C) 99.2 F (37.3 C) 98.4 F (36.9 C) 99.1 F (37.3 C)  TempSrc: Oral Oral Oral Oral  SpO2: 100% 100% 99% 100%  Weight:        Intake/Output Summary (Last 24 hours) at 06/27/2020 1102 Last data filed at 06/27/2020 0500 Gross per 24 hour  Intake 1280 ml  Output --  Net 1280 ml   Filed Weights   06/20/20 1400  Weight: 108.4 kg    Examination:   General: deconditioned  Neurology: Awake and alert, disorientated to place and situation, positive  resting tremors and asterixis.  E ENT: mild pallor, positive icterus, oral mucosa moist Cardiovascular: No JVD. S1-S2 present, rhythmic, no gallops, rubs, or murmurs. No lower extremity edema. Pulmonary:  positive breath sounds bilaterally, adequate air movement, no wheezing, rhonchi or rales. Gastrointestinal. Abdomen soft and non tender Skin. No rashes Musculoskeletal: no joint deformities     Data Reviewed: I have personally reviewed following labs and imaging studies  CBC: Recent Labs  Lab 06/20/20 1343 06/20/20 1343 06/21/20 0100 06/21/20 1046 06/22/20 0353 06/23/20 0824 06/24/20 0616 06/26/20 1054 06/27/20 0111  WBC 11.8*   < > 2.3*   < > 3.5* 5.2 4.6 6.7 5.1  NEUTROABS 5.2  --  1.4*  --  2.5 3.1 2.5  --   --   HGB 8.6*   < > 4.5*   < > 8.3* 8.2* 8.8* 9.2* 8.5*  HCT 30.5*   < > 14.5*   < > 26.0* 25.9* 29.1* 29.0* 26.8*  MCV 84.7   < > 76.3*   < > 83.3 84.1 86.1 84.5 83.8  PLT 41*   < > 35*   < > 45* 72* 66* 144*  112*   < > = values in this interval not displayed.   Basic Metabolic Panel: Recent Labs  Lab 06/21/20 0100 06/21/20 0100 06/22/20 0353 06/23/20 0824 06/24/20 0616 06/25/20 0448 06/26/20 1054 06/27/20 0111  NA 135   < > 137 137 134*  --  134* 135  K 3.5   < > 4.4 3.9 3.7  --  3.0* 3.5  CL 103   < > 107 106 104  --  102 105  CO2 21*   < > 21* 22 22  --  22 22  GLUCOSE 100*   < > 130* 107* 114*  --  108* 113*  BUN <5*   < > 9 8 7   --  5* 7  CREATININE 0.77   < > 0.93 0.96 0.78  --  0.98 0.84  CALCIUM 7.1*   < > 7.2* 7.5* 7.8*  --  7.9* 7.9*  MG 1.6*  --  1.7 1.7 1.6* 1.8  --   --   PHOS 3.6  --  4.4 2.9 3.2  --   --   --    < > = values in this interval not displayed.   GFR: CrCl cannot be calculated (Unknown ideal weight.). Liver Function Tests: Recent Labs  Lab 06/22/20 0353 06/23/20 0824 06/24/20 0616 06/26/20 1054 06/27/20 0111  AST 95* 84* 105* 129* 105*  ALT 24 23 27  38 34  ALKPHOS 110 104 126 128* 122  BILITOT 5.5* 4.9* 4.7* 4.9*  3.5*  PROT 5.5* 6.0* 5.8* 6.4* 5.7*  ALBUMIN 2.4* 2.4* 2.5* 2.8* 2.5*   No results for input(s): LIPASE, AMYLASE in the last 168 hours. Recent Labs  Lab 06/21/20 1745 06/26/20 1054  AMMONIA 40* 26   Coagulation Profile: Recent Labs  Lab 06/22/20 0353 06/23/20 0824 06/24/20 0616 06/26/20 1054 06/27/20 0111  INR 1.4* 1.5* 1.4* 1.5* 1.6*   Cardiac Enzymes: No results for input(s): CKTOTAL, CKMB, CKMBINDEX, TROPONINI in the last 168 hours. BNP (last 3 results) No results for input(s): PROBNP in the last 8760 hours. HbA1C: No results for input(s): HGBA1C in the last 72 hours. CBG: Recent Labs  Lab 06/23/20 1647 06/23/20 1933 06/23/20 2315 06/24/20 0310 06/24/20 0738  GLUCAP 139* 145* 121* 130* 106*   Lipid Profile: No results for input(s): CHOL, HDL, LDLCALC, TRIG, CHOLHDL, LDLDIRECT in the last 72 hours. Thyroid Function Tests: No results for input(s): TSH, T4TOTAL, FREET4, T3FREE, THYROIDAB in the last 72 hours. Anemia Panel: No results for input(s): VITAMINB12, FOLATE, FERRITIN, TIBC, IRON, RETICCTPCT in the last 72 hours.    Radiology Studies: I have reviewed all of the imaging during this hospital visit personally     Scheduled Meds: . lactulose  20 g Oral Q8H  . levETIRAcetam  500 mg Oral BID  . pantoprazole  40 mg Oral BID  . potassium chloride  20 mEq Oral BID  . predniSONE  40 mg Oral Q breakfast  . rifaximin  200 mg Oral BID  . [START ON 06/29/2020] thiamine injection  100 mg Intravenous Daily   Continuous Infusions: . sodium chloride    . sodium chloride Stopped (06/22/20 0651)  . levETIRAcetam Stopped (06/23/20 2220)  . thiamine injection 250 mg (06/27/20 1005)     LOS: 7 days        Scottie Metayer 08/23/20, MD

## 2020-06-27 NOTE — Progress Notes (Addendum)
Physical Therapy Treatment Patient Details Name: Matthew Horn MRN: [id number] DOB: 06-28-1981 Today's Date: 06/27/2020    History of Present Illness Pt is a 39 y/o male admitted after seizure. Pt fell and hit his head. Found to have shear injuries with intraparenchymal hemorrhage affecting both frontal regions, the left temporal tip, the inferolateral left temporal lobe, and both parieto-occipital regions. Pt also found to have anemia and received transfusion. Pt is s/p EGD that revealed erosive esophagitis and duodenal ulcers. Pt also with new alcoholic hepatitis. PMH includes alcohol abuse.     PT Comments    Pt is progressing well with his mobility, walking further distances, but still with some mild balance deficits requiring up to min assist to prevent LOB mostly related to impulsivity, lack of awareness of his balance deficits.  He has significant cognitive deficits making him need 24/7 supervision at discharge.  His mother is rightfully scared to take him home due to his complete lack of awareness of safety.  She is requesting post acute therapy consideration. PT will continue to follow acutely for safe mobility progression. Pt given cards and crossword puzzle book to work on with girlfriend's assist.   Follow Up Recommendations  CIR     Equipment Recommendations  None recommended by PT    Recommendations for Other Services       Precautions / Restrictions Precautions Precautions: Fall Precaution Comments: Pt impulsive, unsteady    Mobility  Bed Mobility               General bed mobility comments: Walking in hallway with RN student and girlfriend.   Transfers Overall transfer level: Needs assistance Equipment used: None Transfers: Sit to/from Stand Sit to Stand: Supervision         General transfer comment: supervision for safety due to speed of movement and lack of awareness of deficits.   Ambulation/Gait Ambulation/Gait assistance: Min guard;Min  assist Gait Distance (Feet): 1000 Feet Assistive device: None Gait Pattern/deviations: Step-through pattern;Staggering left;Staggering right Gait velocity: too fast to be safe   General Gait Details: Pt with staggering gait pattern, cues for slower speed to be safe, staggers with up to min assist for several LOB.  When asked how his balance is he reports, "good".     Stairs Stairs: Yes Stairs assistance: Min assist Stair Management: One rail Right;Alternating pattern;Forwards Number of Stairs: 10 General stair comments: Min assist on stairs, again because pt is going too fast up them to be safe, disreguarding cues to slow down and do one step at a time, forward pitching.    Wheelchair Mobility    Modified Rankin (Stroke Patients Only)       Balance Overall balance assessment: Needs assistance Sitting-balance support: Feet supported;No upper extremity supported Sitting balance-Leahy Scale: Good     Standing balance support: No upper extremity supported Standing balance-Leahy Scale: Good                   Standardized Balance Assessment Standardized Balance Assessment : Dynamic Gait Index   Dynamic Gait Index Level Surface: Mild Impairment Change in Gait Speed: Mild Impairment Gait with Horizontal Head Turns: Mild Impairment Gait with Vertical Head Turns: Mild Impairment Gait and Pivot Turn: Mild Impairment Step Over Obstacle: Moderate Impairment Steps: Severe Impairment      Cognition Arousal/Alertness: Awake/alert Behavior During Therapy: Impulsive Overall Cognitive Status: Impaired/Different from baseline Area of Impairment: Orientation;Attention;Memory;Safety/judgement;Following commands;Problem solving;Awareness;Rancho level  Rancho Levels of Cognitive Functioning Rancho Los Amigos Scales of Cognitive Functioning: Automatic/appropriate Orientation Level: Disoriented to;Person;Place;Time;Situation Current Attention Level:  Sustained Memory: Decreased short-term memory;Decreased recall of precautions (about 5 min reset) Following Commands: Follows one step commands consistently Safety/Judgement: Decreased awareness of safety;Decreased awareness of deficits Awareness: Intellectual   General Comments: Pt is impulsive, quick to move, has significant difficulty with orientation questions, cannot remember his girlfriend's name Asher Muir), complete lack of awareness of his significant cognitive deficits and balance deficits, tangental and confabulatory, needs frequent redirection.        Exercises      General Comments General comments (skin integrity, edema, etc.): Worked on path finding, cognitive multitasking during gait (serial 3's down from 100, alphabet animals).        Pertinent Vitals/Pain Pain Assessment: No/denies pain    Home Living                      Prior Function            PT Goals (current goals can now be found in the care plan section) Acute Rehab PT Goals Patient Stated Goal: family would like him to be safe and return to normal  Progress towards PT goals: Progressing toward goals    Frequency    Min 3X/week      PT Plan Current plan remains appropriate    Co-evaluation              AM-PAC PT "6 Clicks" Mobility   Outcome Measure  Help needed turning from your back to your side while in a flat bed without using bedrails?: None Help needed moving from lying on your back to sitting on the side of a flat bed without using bedrails?: None Help needed moving to and from a bed to a chair (including a wheelchair)?: None Help needed standing up from a chair using your arms (e.g., wheelchair or bedside chair)?: None Help needed to walk in hospital room?: A Little Help needed climbing 3-5 steps with a railing? : A Little 6 Click Score: 22    End of Session   Activity Tolerance: Patient tolerated treatment well Patient left: in chair;with call bell/phone within  reach;with family/visitor present   PT Visit Diagnosis: Other abnormalities of gait and mobility (R26.89);Other symptoms and signs involving the nervous system (H63.149)     Time: 1432-1600 PT Time Calculation (min) (ACUTE ONLY): 88 min  Charges:  $Gait Training: 23-37 mins $Therapeutic Activity: 23-37 mins $Self Care/Home Management: 8-22                    Corinna Capra, PT, DPT  Acute Rehabilitation [phone] pager [phone]) [phone] office

## 2020-06-27 NOTE — Care Management (Signed)
Prescriptions sent to [facility]. Patient entered in [facility] program with no co pay.    Ronny Flurry RN

## 2020-06-27 NOTE — TOC Initial Note (Addendum)
Transition of Care [facility]) - Initial/Assessment Note    Patient Details  Name: Matthew Horn MRN: [id number] Date of Birth: [birth year]  Transition of Care [facility]) CM/SW Contact:    Kingsley Plan, RN Phone Number: 06/27/2020, 11:45 AM  Clinical Narrative:                 Earlier today was asked by [facility] to enter in [facility] program. Same done.   Received a message patient's mother wanted to speak to NCM. NCM went to room patient and girlfriend at bedside. Patient consented for NCM to speak to his mother. Girlfriend called her on phone.   Mother Matthew Horn , does not think patient can make his own decisions at present and wants him placed in SNF for that reason. Patient does not have insurance, according to mother, patient and brother spoke to financial counselor regarding emergency medicaid. Will follow up with FC.   Also discussed patient would have to be willing to go and willing to stay. Patient present and heard conversion but did not participate in conversion.   NCM emailed Financial Navigator [place]. Await response.   Secure chatted [facility].  Expected Discharge Plan: Home/Self Care     Patient Goals and CMS Choice Patient states their goals for this hospitalization and ongoing recovery are:: to go home CMS Medicare.gov Compare Post Acute Care list provided to:: Patient Choice offered to / list presented to : Patient  Expected Discharge Plan and Services Expected Discharge Plan: Home/Self Care       Living arrangements for the past 2 months: Single Family Home                 DME Arranged: N/A         HH Arranged: NA          Prior Living Arrangements/Services Living arrangements for the past 2 months: Single Family Home   Patient language and need for interpreter reviewed:: Yes Do you feel safe going back to the place where you live?: Yes            Criminal Activity/Legal Involvement Pertinent to Current  Situation/Hospitalization: No - Comment as needed  Activities of Daily Living Home Assistive Devices/Equipment: Eyeglasses ADL Screening (condition at time of admission) Patient's cognitive ability adequate to safely complete daily activities?: Yes Is the patient deaf or have difficulty hearing?: No Does the patient have difficulty seeing, even when wearing glasses/contacts?: No Does the patient have difficulty concentrating, remembering, or making decisions?: Yes Patient able to express need for assistance with ADLs?: Yes Does the patient have difficulty dressing or bathing?: No Independently performs ADLs?: Yes (appropriate for developmental age) Does the patient have difficulty walking or climbing stairs?: Yes Weakness of Legs: Both Weakness of Arms/Hands: None  Permission Sought/Granted   Permission granted to share information with : Yes, Verbal Permission Granted  Share Information with NAME: Matthew Horn , mother           Emotional Assessment Appearance:: Appears stated age Attitude/Demeanor/Rapport: Engaged Affect (typically observed): Accepting Orientation: : Oriented to Place, Oriented to Self, Oriented to  Time, Oriented to Situation Alcohol / Substance Use: Illicit Drugs Psych Involvement: No (comment)  Admission diagnosis:  Intracerebral hemorrhage (HCC) [I61.9] Cirrhosis (HCC) [K74.60] Thrombocytopenia (HCC) [D69.6] Intracranial bleeding (HCC) [I62.9] Serum total bilirubin elevated [R17] Prolonged INR [R79.1] Laceration of scalp, initial encounter [S01.01XA] Alcohol withdrawal seizure with complication (HCC) [K27.062, R56.9] Alcoholic hepatitis, unspecified whether ascites present [K70.10] Patient Active Problem  List   Diagnosis Date Noted   Alcohol withdrawal seizure with complication (HCC)    Alcoholic hepatitis    Cirrhosis (HCC)    Thrombocytopenia (HCC)    Duodenal ulcer    Acute blood loss anemia    Intracerebral hemorrhage (HCC) 06/20/2020    PCP:  Patient, No Pcp Per Pharmacy:   [facility] [phone] - [place], [place] - [address] AT [facility] OF [address] [place] [address] Phone: [phone] Fax: [phone]  [facility] - [place], [place] - [address] [place] [address] Phone: [phone] Fax: [phone]     Social Determinants of Health (SDOH) Interventions    Readmission Risk Interventions No flowsheet data found.

## 2020-06-27 NOTE — Progress Notes (Signed)
Initial Nutrition Assessment  RD working remotely.  DOCUMENTATION CODES:   Not applicable  INTERVENTION:   -Ensure Enlive po BID, each supplement provides 350 kcal and 20 grams of protein -MVI with minerals daily  NUTRITION DIAGNOSIS:   Increased nutrient needs related to chronic illness (cirrhosis) as evidenced by estimated needs.  GOAL:   Patient will meet greater than or equal to 90% of their needs  MONITOR:   PO intake, Supplement acceptance, Labs, Weight trends, Skin, I & O's  REASON FOR ASSESSMENT:   Consult Assessment of nutrition requirement/status  ASSESSMENT:   38 year old man with significant alcohol history.  Witnessed generalized tonic-clonic seizure with loss of consciousness while exiting restaurant  Pt admitted with traumatic intraparenchymal hemorrhage following fall related to witnessed seizure.   9/2- s/p EGD- revealed reflux esophagitis, esophageal varices, portal hypertensive gastropathy,a dn  non-bleeding duodenal ulcers  Reviewed I/O's: +1.6 L x 24 hours and +6 L since admission  Attempted to speak with pt via call to hospital room phone, however, no answer.  Pt on a regular diet, with good intake. Noted meal completions 50-100%.   No weight history available to assess at this time.   Per chart review, pt with long-standing ETOH abuse. CT of abdomen revealed alcoholic cirrhosis. Given increased nutritional needs, pt would benefit from addition of oral nutrition supplements.   Medications reviewed and include thiamine, lactulose, prednisone, KCl, and MVI.   Albumin has a half-life of 21 days and is strongly affected by stress response and inflammatory process, therefore, do not expect to see an improvement in this lab value during acute hospitalization. When a patient presents with low albumin, it is likely skewed due to the acute inflammatory response.  Unless it is suspected that patient had poor PO intake or malnutrition prior to admission,  then RD should not be consulted solely for low albumin. Note that low albumin is no longer used to diagnose malnutrition; [place] uses the new malnutrition guidelines published by the American Society for Parenteral and Enteral Nutrition (A.S.P.E.N.) and the Academy of Nutrition and Dietetics (AND).     Labs reviewed: Na: 134, K: 3.0, CBGS: 106.   Diet Order:   Diet Order            Diet regular Room service appropriate? Yes; Fluid consistency: Thin  Diet effective now           Diet - low sodium heart healthy                 EDUCATION NEEDS:   No education needs have been identified at this time  Skin:  Skin Assessment: Skin Integrity Issues: Skin Integrity Issues:: Incisions Incisions: scalp  Last BM:  06/26/20  Height:   Ht Readings from Last 1 Encounters:  06/27/20 6\' 4"  (1.93 m)    Weight:   Wt Readings from Last 1 Encounters:  06/20/20 108.4 kg    Ideal Body Weight:  91.8 kg  BMI:  Body mass index is 29.09 kg/m.  Estimated Nutritional Needs:   Kcal:  2300-2500  Protein:  120-135 grams  Fluid:  2 L    06/22/20, RD, LDN, CDCES Registered Dietitian II Certified Diabetes Care and Education Specialist Please refer to [facility] for RD and/or RD on-call/weekend/after hours pager

## 2020-06-28 DIAGNOSIS — S06361S Traumatic hemorrhage of cerebrum, unspecified, with loss of consciousness of 30 minutes or less, sequela: Secondary | ICD-10-CM

## 2020-06-28 DIAGNOSIS — Z720 Tobacco use: Secondary | ICD-10-CM

## 2020-06-28 DIAGNOSIS — S062X1A Diffuse traumatic brain injury with loss of consciousness of 30 minutes or less, initial encounter: Secondary | ICD-10-CM

## 2020-06-28 DIAGNOSIS — F1010 Alcohol abuse, uncomplicated: Secondary | ICD-10-CM

## 2020-06-28 DIAGNOSIS — S062XAA Diffuse traumatic brain injury with loss of consciousness status unknown, initial encounter: Secondary | ICD-10-CM

## 2020-06-28 LAB — BASIC METABOLIC PANEL
Anion gap: 8 (ref 5–15)
BUN: 6 mg/dL (ref 6–20)
CO2: 22 mmol/L (ref 22–32)
Calcium: 8.1 mg/dL — ABNORMAL LOW (ref 8.9–10.3)
Chloride: 105 mmol/L (ref 98–111)
Creatinine, Ser: 0.79 mg/dL (ref 0.61–1.24)
GFR calc Af Amer: >60 mL/min (ref >60)
GFR calc non Af Amer: >60 mL/min (ref >60)
Glucose, Bld: 104 mg/dL — ABNORMAL HIGH (ref 70–99)
Potassium: 3.8 mmol/L (ref 3.5–5.1)
Sodium: 135 mmol/L (ref 135–145)

## 2020-06-28 LAB — AMMONIA: Ammonia: 27 umol/L (ref 9–35)

## 2020-06-28 MED ORDER — POTASSIUM CHLORIDE CRYS ER 20 MEQ PO TBCR
40.0000 meq | EXTENDED_RELEASE_TABLET | Freq: Once | ORAL | Status: AC
  Administered 2020-06-28: 40 meq via ORAL
  Filled 2020-06-28: qty 2

## 2020-06-28 NOTE — Progress Notes (Signed)
Patient's IV catheter was dislodged when patient taking his shower early this morning.  On call [facility] was made aware and M. Katherina Right, NP called back informing this RN to leave it out. Pt's meds are all POs, no IVF and taking POs well with regular diet. Patient currently asleep.

## 2020-06-28 NOTE — Progress Notes (Signed)
PROGRESS NOTE    Jihad Brownlow  ZJQ:[id number] DOB: 1981/06/26 DOA: 06/20/2020 PCP: Patient, No Pcp Per    Brief Narrative:  Patient admitted to the hospital with a working diagnosis of brain traumatic intraparenchymal hemorrhage following a fall, due to alcohol related seizure.  Complicated by acute alcoholic hepatitis and hepatic encephalopathy..  39 year old male with past medical history for alcohol abuse.  Patient was witnessed to have a generalized tonic-clonic seizure, while exiting a restaurant.  Patient had 3 seizures on route to the hospital (received Versed).  On his initial physical examination his blood pressure was 133/80, heart rate 138, temperature 99.5, respiratory rate of 14, oxygen saturation 100%.  He had a laceration on his right hand, positive scleral icterus, his lungs are clear to auscultation bilaterally, heart S1-S2, present rhythm, his abdomen was soft, Glasgow Coma Scale was 15, confused, nonfocal. Sodium 135, potassium 3.6, chloride 101, bicarb 9, glucose 105, BUN 6, creatinine 1.14, AST 156, ALT 27, white count 11.8, hemoglobin 8.6, hematocrit 30.5, platelets 41, INR 1.7.  SARS COVID-19 negative.  Urinalysis specific gravity 1.016, 100 protein, 0-5 red cells, 0-5 white cells.  Toxicology positive for alcohol 15, positive for benzodiazepines. Head CT with moderate volume acute intraparenchymal hemorrhage in the anterior aspect of the right frontal lobe extending local mass-effect.  No significant midline shift.  Additional small volume acute intraparenchymal hemorrhage in the anterior left temporal lobe and superior medial aspect of the right occipital lobe.  Small volume associated with subarachnoid hemorrhage.  Positive for motion artifact CT cervical spine, but no acute fracture.  Chest radiograph with right basal atelectasis. EKG 151 bpm, normal axis, normal intervals, sinus rhythm, no significant ST segment or T wave changes.  Patient was admitted to the  intensive care unit, he was placed on Keppra for seizure control.  CIWA protocol for alcohol withdrawal.  Patient required dexmedetomidine due to agitation.  He was diagnosed with acute alcoholic hepatitis with hepatic encephalopathy, was placed on lactulose and rifaximin.  Patient underwent upper endoscopy which showed mild esophageal varices.  Currently tolerating well medical therapy with lactulose, prednisone and lorazepam. Discontinued rifaximin, patient not able to afford this medication as outpatient.  Patient's cognitive infusion has been more stable, now pending inpatient rehab evaluation.    Assessment & Plan:   Principal Problem:   Intracerebral hemorrhage (HCC) Active Problems:   Alcohol withdrawal seizure with complication (HCC)   Alcoholic hepatitis   Cirrhosis (HCC)   Thrombocytopenia (HCC)   Duodenal ulcer   Acute blood loss anemia    1. Acute traumatic intracranial hemorrhage, right frontal lobe, left temporal lobe, right occipital lobe/ complicated with seizures.  Lorazepam has been started bid, with good toleration. Per nursing report he has been calm overnight.   Continue with keppra for seizure prophylaxis with good toleration. Now pending formal evaluation for inpatient rehab. If patient is not candidate I have informed his mother that patient will need close supervision at home.   2. Alcohol intoxication with seizures.  Improved in tremors, clinically patient is more calm. Will continue with current dose of lorazepam 1 mg po bid for now.  3. Acute alcoholic hepatitis/ chronic alcoholic liver cirrhosis (esophageal varices)/ acute on chronic liver failure. ammonia has been stable 26-37-27.  Asterixis has improved and patient having watery bowel movements.   Continue current dose of lactulose 30 mg tid for now, prednisone to complete 28 days taper and thiamine/ multivitamins.   4. Acute blood loss anemia due to upper GI bleed/ duodenal  ulcer. Sp 2 units PRBC  transfusion.  No nausea or vomiting, continue with pantoprazole with good toleration.   5. Thrombocytopenia due to chronic liver failure. sp  2 pool of platelet transfusion. Platelet count has been stable, will need follow up as outpatient.   6. Hypokalemia. K is 3,8, will add 40 kcl today and will follow electrolytes in am, patient is on high dose of lactulose.     Status is: Inpatient  Remains inpatient appropriate because:Inpatient level of care appropriate due to severity of illness   Dispo: The patient is from: Home              Anticipated d/c is to: CIR              Anticipated d/c date is: 2 days              Patient currently is not medically stable to d/c. Possible discharge to CIR in am or home with close supervision.   DVT prophylaxis: scd   Code Status:    full  Family Communication:  I spoke over the phone with the patient's mother about patient's  condition, plan of care, prognosis and all questions were addressed.     Nutrition Status: Nutrition Problem: Increased nutrient needs Etiology: chronic illness (cirrhosis) Signs/Symptoms: estimated needs Interventions: MVI, Ensure Enlive (each supplement provides 350kcal and 20 grams of protein)     Consultants:  Neurology  Neurosurgery  Gastroenterology  Pulmonary critical care medicine  Procedures:  EEG  EGD  2 units of packed red blood cells  2 units of platelets  2 units of FFP  Subjective: Patient is feeling well, no nausea or vomiting, positive watery bowel movements, no chest pain or dyspnea. He has been more calm per nursing report, intermittent confusion but easy to be redirected.   Objective: Vitals:   06/27/20 0434 06/27/20 1554 06/27/20 2122 06/28/20 0428  BP: 116/90 (!) 133/92 (!) 146/90 140/88  Pulse: 73 72 91 74  Resp: 17 18 18 18   Temp: 99.1 F (37.3 C) 98.2 F (36.8 C) 98.2 F (36.8 C) 98.6 F (37 C)  TempSrc: Oral Oral Oral Oral  SpO2: 100% 100% 100% 100%    Weight:      Height:  6\' 4"  (1.93 m)      Intake/Output Summary (Last 24 hours) at 06/28/2020 1203 Last data filed at 06/28/2020 0659 Gross per 24 hour  Intake 980 ml  Output --  Net 980 ml   Filed Weights   06/20/20 1400  Weight: 108.4 kg    Examination:   General: not in pain or dyspnea, not agitated at the time of my examination.  Neurology: Awake and alert, non focal  E ENT: mild pallor, mild icterus, oral mucosa moist Cardiovascular: No JVD. S1-S2 present, rhythmic, no gallops, rubs, or murmurs. No lower extremity edema. Pulmonary:  Positive breath sounds bilaterally, adequate air movement, no wheezing, rhonchi or rales. Gastrointestinal. Abdomen soft and non tender Skin. No rashes Musculoskeletal: no joint deformities     Data Reviewed: I have personally reviewed following labs and imaging studies  CBC: Recent Labs  Lab 06/22/20 0353 06/23/20 0824 06/24/20 0616 06/26/20 1054 06/27/20 0111  WBC 3.5* 5.2 4.6 6.7 5.1  NEUTROABS 2.5 3.1 2.5  --   --   HGB 8.3* 8.2* 8.8* 9.2* 8.5*  HCT 26.0* 25.9* 29.1* 29.0* 26.8*  MCV 83.3 84.1 86.1 84.5 83.8  PLT 45* 72* 66* 144* 112*   Basic Metabolic Panel: Recent Labs  Lab 06/22/20 0353 06/22/20 0353 06/23/20 0824 06/24/20 0616 06/25/20 0448 06/26/20 1054 06/27/20 0111 06/28/20 0421  NA 137   < > 137 134*  --  134* 135 135  K 4.4   < > 3.9 3.7  --  3.0* 3.5 3.8  CL 107   < > 106 104  --  102 105 105  CO2 21*   < > 22 22  --  22 22 22   GLUCOSE 130*   < > 107* 114*  --  108* 113* 104*  BUN 9   < > 8 7  --  5* 7 6  CREATININE 0.93   < > 0.96 0.78  --  0.98 0.84 0.79  CALCIUM 7.2*   < > 7.5* 7.8*  --  7.9* 7.9* 8.1*  MG 1.7  --  1.7 1.6* 1.8  --   --   --   PHOS 4.4  --  2.9 3.2  --   --   --   --    < > = values in this interval not displayed.   GFR: Estimated Creatinine Clearance: 172.3 mL/min (by C-G formula based on SCr of 0.79 mg/dL). Liver Function Tests: Recent Labs  Lab 06/22/20 0353 06/23/20 0824  06/24/20 0616 06/26/20 1054 06/27/20 0111  AST 95* 84* 105* 129* 105*  ALT 24 23 27  38 34  ALKPHOS 110 104 126 128* 122  BILITOT 5.5* 4.9* 4.7* 4.9* 3.5*  PROT 5.5* 6.0* 5.8* 6.4* 5.7*  ALBUMIN 2.4* 2.4* 2.5* 2.8* 2.5*   No results for input(s): LIPASE, AMYLASE in the last 168 hours. Recent Labs  Lab 06/21/20 1745 06/26/20 1054 06/27/20 1210 06/28/20 0421  AMMONIA 40* 26 37* 27   Coagulation Profile: Recent Labs  Lab 06/22/20 0353 06/23/20 0824 06/24/20 0616 06/26/20 1054 06/27/20 0111  INR 1.4* 1.5* 1.4* 1.5* 1.6*   Cardiac Enzymes: No results for input(s): CKTOTAL, CKMB, CKMBINDEX, TROPONINI in the last 168 hours. BNP (last 3 results) No results for input(s): PROBNP in the last 8760 hours. HbA1C: No results for input(s): HGBA1C in the last 72 hours. CBG: Recent Labs  Lab 06/23/20 1647 06/23/20 1933 06/23/20 2315 06/24/20 0310 06/24/20 0738  GLUCAP 139* 145* 121* 130* 106*   Lipid Profile: No results for input(s): CHOL, HDL, LDLCALC, TRIG, CHOLHDL, LDLDIRECT in the last 72 hours. Thyroid Function Tests: No results for input(s): TSH, T4TOTAL, FREET4, T3FREE, THYROIDAB in the last 72 hours. Anemia Panel: No results for input(s): VITAMINB12, FOLATE, FERRITIN, TIBC, IRON, RETICCTPCT in the last 72 hours.    Radiology Studies: I have reviewed all of the imaging during this hospital visit personally     Scheduled Meds: . feeding supplement (ENSURE ENLIVE)  237 mL Oral BID BM  . lactulose  30 g Oral Q8H  . levETIRAcetam  500 mg Oral BID  . LORazepam  1 mg Oral BID  . multivitamins with iron  1 tablet Oral Daily  . pantoprazole  40 mg Oral BID  . predniSONE  40 mg Oral Q breakfast  . thiamine  100 mg Oral Daily   Continuous Infusions: . sodium chloride       LOS: 8 days        Darcie Mellone 08/24/20, MD

## 2020-06-28 NOTE — Progress Notes (Signed)
Inpatient Rehabilitation Admissions Coordinator  I met with patient and his girlfriend at bedside. I spoke with his Mom, Esau Fridman, by phone. Patient not in need of acute inpt rehab at this level. Ambulating with supervision in his room. Outpatient cognitive rehab and 24/7 support of family is recommended. Mom is local at a hotel and requesting information of possible outpatient therapy for cognitive rehab until she feels comfortable in driving them back to [place]. I have alerted acute and TOC team . We will sign off at this time.  Danne Baxter, RN, MSN Rehab Admissions Coordinator [phone] 06/28/2020 2:59 PM

## 2020-06-28 NOTE — Evaluation (Addendum)
Occupational Therapy Evaluation Patient Details Name: Matthew Horn MRN: [id number] DOB: 12/13/1980 Today's Date: 06/28/2020    History of Present Illness Pt is a 39 y/o male admitted after seizure. Pt fell and hit his head. Found to have shear injuries with intraparenchymal hemorrhage affecting both frontal regions, the left temporal tip, the inferolateral left temporal lobe, and both parieto-occipital regions. Pt also found to have anemia and received transfusion. Pt is s/p EGD that revealed erosive esophagitis and duodenal ulcers. Pt also with new alcoholic hepatitis. PMH includes alcohol abuse.    Clinical Impression   Patient presents with significant cognitive dysfunction compared to baseline, impacting his independence with ADL, IADL, and ability to return to full time employment.  PLOF: the patient was completely independent with all care and mobility.  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  The patient would benefit from continued OT in the acute care setting.  In addition, CIR is being recommended to help him regain functional independence to return to full time employment and allow him to live by himself independently.  Patient presents closer to [facility] amigos scale VI: confused appropriate.  Emerging goal directed behavior.  Consistently follows simple commands.  Shows carry over for re-learned simple tasks.  Becoming more aware of environment and memory improving.   OT to continue assess vision and cognition on-going.  Family requesting TBI booklet/informantion.      Follow Up Recommendations  CIR    Equipment Recommendations       Recommendations for Other Services Rehab consult     Precautions / Restrictions Precautions Precautions: Fall Precaution Comments: Pt impulsive, unsteady Restrictions Weight Bearing Restrictions: No      Mobility Bed Mobility Overal bed mobility: Needs Assistance              General bed mobility comments: Walking in hallway with RN student and girlfriend.   Transfers Overall transfer level: Needs assistance Equipment used: None Transfers: Sit to/from Stand Sit to Stand: Supervision              Balance   Sitting-balance support: Feet supported;No upper extremity supported Sitting balance-Leahy Scale: Good     Standing balance support: No upper extremity supported Standing balance-Leahy Scale: Good                             ADL either performed or assessed with clinical judgement   ADL Overall ADL's : Needs assistance/impaired Eating/Feeding: Independent   Grooming: Wash/dry hands;Wash/dry face;Oral care;Supervision/safety   Upper Body Bathing: Supervision/ safety;Set up;Sitting;Cueing for safety   Lower Body Bathing: Supervison/ safety;Set up;Cueing for safety;Sit to/from stand   Upper Body Dressing : Supervision/safety;Set up;Cueing for safety;Sitting;Standing   Lower Body Dressing: Supervision/safety;Set up;Cueing for safety;Sit to/from stand   Toilet Transfer: Supervision/safety;Set up;Min guard;Ambulation   Toileting- Clothing Manipulation and Hygiene: Modified independent;Sit to/from stand       Functional mobility during ADLs: Supervision/safety;Set up;Min guard;Cueing for safety General ADL Comments: patient is able to physically perform self care and toileting.  He is requiring a lot of supervision and safety cues to reduce fall risk s/p TBI.     Vision Baseline Vision/History: Wears glasses Wears Glasses: Reading only Patient Visual Report: No change from baseline OT to continue to assess vision.       Perception     Praxis      Pertinent Vitals/Pain Faces Pain Scale: Hurts a little  bit Pain Location: neck and R shoulder Pain Descriptors / Indicators: Dull Pain Intervention(s): Monitored during session     Hand Dominance Right   Extremity/Trunk Assessment Upper Extremity Assessment Upper  Extremity Assessment: Overall WFL for tasks assessed   Lower Extremity Assessment Lower Extremity Assessment: Defer to PT evaluation   Cervical / Trunk Assessment Cervical / Trunk Assessment: Normal   Communication Communication Communication: No difficulties   Cognition Arousal/Alertness: Awake/alert Behavior During Therapy: Impulsive Overall Cognitive Status: Impaired/Different from baseline Area of Impairment: Orientation;Attention;Memory;Safety/judgement;Following commands;Problem solving;Awareness;Rancho level OT to continue to assess cognition.                 Rancho Levels of Cognitive Functioning Rancho Los Amigos Scales of Cognitive Functioning:Confused/appropriate Orientation Level: Disoriented to;Place;Situation (patient able to recall time and oriented to GF and mother.) Current Attention Level: Sustained Memory: Decreased short-term memory;Decreased recall of precautions Following Commands: Follows multi-step commands with increased time Safety/Judgement: Decreased awareness of safety;Decreased awareness of deficits Awareness: Intellectual Problem Solving: Slow processing General Comments: Mother states she is seeing some slow improvement with orientation and some acceptance of injuries.  But, fluctuates throughout the day. OT will continue to assess.    General Comments                  Home Living Family/patient expects to be discharged to:: Private residence Living Arrangements: Alone Available Help at Discharge: Family;Available 24 hours/day;Other (Comment) (Patient plans on returning with [place] with his mother for the short term) Type of Home: House Home Access: Level entry     Home Layout: One level     Bathroom Shower/Tub: Producer, television/film/video: Standard     Home Equipment: None          Prior Functioning/Environment Level of Independence: Independent        Comments: all mobility and care.  worked FT and  drove/traveled all over the country for work.        OT Problem List: Impaired balance (sitting and/or standing);Decreased cognition;Decreased safety awareness;Decreased knowledge of use of DME or AE;Decreased knowledge of precautions      OT Treatment/Interventions: Self-care/ADL training;Therapeutic exercise;DME and/or AE instruction;Therapeutic activities;Cognitive remediation/compensation;Balance training;Patient/family education    OT Goals(Current goals can be found in the care plan section) Acute Rehab OT Goals Patient Stated Goal: Patient wishes to return home and to work. OT Goal Formulation: With patient Time For Goal Achievement: 07/20/20 Potential to Achieve Goals: Fair ADL Goals Additional ADL Goal #1: The patient will recall events surrounding his TBI and orientation information with 50% accuracy given frequent minimal verbal cues. Additional ADL Goal #2: The patient will complete a daily journal to assist with personal and family information with minimal cues. Additional ADL Goal #3: Patient will complete basic problem solving tasks related to safty with at least 80% accuracy and frequent minimal verbal cues. Additional ADL Goal #4: The patient will recall 3/5 items after a 5 minute delay given intermittent minimal verbal cues.  OT Frequency: Min 2X/week   Barriers to D/C: Other (comment)  medical status       Co-evaluation              AM-PAC OT "6 Clicks" Daily Activity     Outcome Measure Help from another person eating meals?: None Help from another person taking care of personal grooming?: None Help from another person toileting, which includes using toliet, bedpan, or urinal?: A Little Help from another person bathing (including washing, rinsing, drying)?:  A Little Help from another person to put on and taking off regular upper body clothing?: A Little Help from another person to put on and taking off regular lower body clothing?: A Little 6 Click Score:  20   End of Session Equipment Utilized During Treatment: Gait belt Nurse Communication: Other (comment) (where patient left)  Activity Tolerance: Patient tolerated treatment well Patient left: in chair;with call bell/phone within reach;with family/visitor present  OT Visit Diagnosis: Unsteadiness on feet (R26.81);Other symptoms and signs involving cognitive function                Time: 7858-8502 OT Time Calculation (min): 45 min Charges:  OT General Charges $OT Visit: 1 Visit OT Evaluation $OT Eval Moderate Complexity: 1 Mod  06/28/2020  Rich, OTR/L  Acute Rehabilitation Services  Office:  [phone]   Suzanna Obey 06/28/2020, 1:01 PM

## 2020-06-28 NOTE — Consult Note (Signed)
Physical Medicine and Rehabilitation Consult Reason for Consult: Decreased functional mobility with altered mental status Referring Physician: Triad   HPI: Matthew Horn is a 39 y.o. right-handed male with history of alcohol use as well as tobacco.  History taken from chart review, girlfriend, and mother via telephone due to cognition.  Patient independent prior to admission.  Reportedly planning to stay in [place] with his mother.  He presented on 06/20/20 with witness generalized tonic-clonic seizure/fall with LOC while exiting a restaurant.  3 seizures in route to the hospital and received Versed.  Cranial CT scan showed moderate volume acute intraparenchymal hemorrhage in the anterior aspect of the right frontal lobe exerting local mass-effect with no significant midline shift.  Additional small volume acute intraparenchymal hemorrhage in the anterior left temporal lobe and superior medial aspect of the right occipital lobe.  CT cervical spine negative. CT of abdomen pelvis showed no evidence of portal vein thrombosis.  There was noted cirrhosis. No focal liver abnormality.  Small volume ascites.  Admission chemistries BUN 6 creatinine 1.14 AST 156 alkaline phosphatase 179 total bilirubin 6.2, INR 1.7, ammonia level 40, alcohol 15, hemoglobin 8.6, urine drug screen positive benzos.  Patient was loaded with Keppra.  EEG negative for seizure.  Follow-up MRI 06/21/2020 personally reviewed, showing multiple images.  Additional to previous CT, multiple microhemorrhages too small to be detected on CT.  No midline shift or hydrocephalus.  Follow-up head CT on 9/3 stable gastroenterology consulted for rectal bleeding anemia underwent endoscopy showing esophagitis no bleeding.  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  Patient has been transfused 2 units packed red blood cells with latest hemoglobin 8.5.  Patient seen ambulating in the room independently after having  intermittently use the restroom.  Review of Systems  Constitutional: Negative for chills, fever and malaise/fatigue.  HENT: Negative for hearing loss.   Eyes: Negative for blurred vision and double vision.  Respiratory: Negative for cough and shortness of breath.   Cardiovascular: Negative for chest pain and palpitations.  Gastrointestinal: Positive for constipation. Negative for heartburn, nausea and vomiting.  Genitourinary: Negative for dysuria, flank pain and hematuria.  Musculoskeletal: Negative for myalgias.  Skin: Negative for rash.  Neurological: Positive for seizures. Negative for focal weakness and weakness.  All other systems reviewed and are negative.  Past Medical History:  Diagnosis Date  . Alcohol abuse    Past Surgical History:  Procedure Laterality Date  . ESOPHAGOGASTRODUODENOSCOPY (EGD) WITH PROPOFOL N/A 06/22/2020   Procedure: ESOPHAGOGASTRODUODENOSCOPY (EGD) WITH PROPOFOL;  Surgeon: Napoleon Form, MD;  Location: [facility];  Service: Endoscopy;  Laterality: N/A;  . SHOULDER ARTHROSCOPY Right    No pertinent family history of TBI. Social History:  reports that he has quit smoking. He has never used smokeless tobacco. He reports current alcohol use. He reports previous drug use. Drugs: Cocaine and Marijuana. Allergies: No Known Allergies No medications prior to admission.   Home: Home Living Family/patient expects to be discharged to:: Private residence Living Arrangements: Alone Available Help at Discharge: Family, Available 24 hours/day Type of Home: House Home Access: Level entry Home Layout: One level Bathroom Shower/Tub: Health visitor: Standard Home Equipment: None Additional Comments: planning to stay in [place] with mom  Functional History: Prior Function Level of Independence: Independent Functional Status:  Mobility: Bed Mobility General bed mobility comments: Walking in hallway with RN student and girlfriend.    Transfers Overall transfer level: Needs assistance  Equipment used: None Transfers: Sit to/from Stand Sit to Stand: Supervision General transfer comment: supervision for safety due to speed of movement and lack of awareness of deficits.  Ambulation/Gait Ambulation/Gait assistance: Min guard, Min assist Gait Distance (Feet): 1000 Feet Assistive device: None Gait Pattern/deviations: Step-through pattern, Staggering left, Staggering right General Gait Details: Pt with staggering gait pattern, cues for slower speed to be safe, staggers with up to min assist for several LOB.  When asked how his balance is he reports, "good".   Gait velocity: too fast to be safe Stairs: Yes Stairs assistance: Min assist Stair Management: One rail Right, Alternating pattern, Forwards Number of Stairs: 10 General stair comments: Min assist on stairs, again because pt is going too fast up them to be safe, disreguarding cues to slow down and do one step at a time, forward pitching.     ADL:    Cognition: Cognition Overall Cognitive Status: Impaired/Different from baseline Orientation Level: Oriented to person, Oriented to time, Disoriented to place, Disoriented to situation [employer] of Cognitive Functioning: Automatic/appropriate Cognition Arousal/Alertness: Awake/alert Behavior During Therapy: Impulsive Overall Cognitive Status: Impaired/Different from baseline Area of Impairment: Orientation, Attention, Memory, Safety/judgement, Following commands, Problem solving, Awareness, Rancho level Orientation Level: Disoriented to, Person, Place, Time, Situation Current Attention Level: Sustained Memory: Decreased short-term memory, Decreased recall of precautions (about 5 min reset) Following Commands: Follows one step commands consistently Safety/Judgement: Decreased awareness of safety, Decreased awareness of deficits Awareness: Intellectual General Comments: Pt is impulsive, quick to move,  has significant difficulty with orientation questions, cannot remember his girlfriend's name Matthew Horn), complete lack of awareness of his significant cognitive deficits and balance deficits, tangental and confabulatory, needs frequent redirection.    Blood pressure 140/88, pulse 74, temperature 98.6 F (37 C), temperature source Oral, resp. rate 18, height 6\' 4"  (1.93 m), weight 108.4 kg, SpO2 100 %. Physical Exam Vitals reviewed.  Constitutional:      General: He is not in acute distress.    Appearance: Normal appearance.  HENT:     Head: Normocephalic and atraumatic.     Right Ear: External ear normal.     Left Ear: External ear normal.     Nose: Nose normal.  Eyes:     General:        Right eye: No discharge.        Left eye: No discharge.     Extraocular Movements: Extraocular movements intact.  Cardiovascular:     Rate and Rhythm: Normal rate and regular rhythm.  Pulmonary:     Effort: Pulmonary effort is normal.     Breath sounds: Normal breath sounds.  Abdominal:     General: Abdomen is flat. There is distension.  Musculoskeletal:     Cervical back: Normal range of motion and neck supple.     Comments: No edema or tenderness in extremities  Skin:    General: Skin is warm and dry.  Neurological:     Mental Status: He is alert.     Comments: Alert and oriented x1 Motor: 5/5 throughout Confabulation noted  Psychiatric:        Cognition and Memory: Cognition is impaired. Memory is impaired.        Judgment: Judgment is impulsive.     Results for orders placed or performed during the hospital encounter of 06/20/20 (from the past 24 hour(s))  Ammonia     Status: Abnormal   Collection Time: 06/27/20 12:10 PM  Result Value Ref Range  Ammonia 37 (H) 9 - 35 umol/L  Basic metabolic panel     Status: Abnormal   Collection Time: 06/28/20  4:21 AM  Result Value Ref Range   Sodium 135 135 - 145 mmol/L   Potassium 3.8 3.5 - 5.1 mmol/L   Chloride 105 98 - 111 mmol/L   CO2 22  22 - 32 mmol/L   Glucose, Bld 104 (H) 70 - 99 mg/dL   BUN 6 6 - 20 mg/dL   Creatinine, Ser 9.600.79 0.61 - 1.24 mg/dL   Calcium 8.1 (L) 8.9 - 10.3 mg/dL   GFR calc non Af Amer >60 >60 mL/min   GFR calc Af Amer >60 >60 mL/min   Anion gap 8 5 - 15  Ammonia     Status: None   Collection Time: 06/28/20  4:21 AM  Result Value Ref Range   Ammonia 27 9 - 35 umol/L   No results found.  Assessment/Plan: Diagnosis: TBI/DAI  Ranchos Los Amigos score:  VI  Speech to evaluate for Post traumatic amnesia and interval GOAT scores to assess progress.  NeuroPsych evaluation for behavorial assessment.  Provide environmental management by reducing the level of stimulation, tolerating restlessness when possible, protecting patient from harming self or others and reducing patient's cognitive confusion.  Address behavioral concerns include providing structured environments and daily routines.  Cognitive therapy to direct modular abilities in order to maintain goals  including problem solving, self regulation/monitoring, self management, attention, and memory.  Fall precautions; pt at risk for second impact syndrome  Prevention of secondary injury: monitor for hypotension, hypoxia, seizures or signs of increased ICP  AED:   PT/OT consults for mobility strengthening, endurance training and adaptive ADLs   Avoid medications that could impair cognitive abilities, such as anticholinergics, antihistaminic, benzodiazapines, narcotics, etc when possible Labs and images (see above) independently reviewed.  Records reviewed and summated above.  1. Does the need for close, 24 hr/day medical supervision in concert with the patient's rehab needs make it unreasonable for this patient to be served in a less intensive setting? Potentially  2. Co-Morbidities requiring supervision/potential complications: Tobacco abuse (counsel and appropriate), alcohol abuse (counsel when appropriate), acute blood loss anemia (repeat labs,  consider transfusion if necessary to ensure appropriate perfusion for increased activity tolerance), cirrhosis (avoid hepatotoxic meds), thrombocytopenia (see previous, Thrombocytopenia < 60,000/mm3 no resistive exercise) 3. Due to safety, disease management and patient education, does the patient require 24 hr/day rehab nursing? Potentially 4. Does the patient require coordinated care of a physician, rehab nurse, therapy disciplines of PT/OT/SLP to address physical and functional deficits in the context of the above medical diagnosis(es)? Potentially Addressing deficits in the following areas: balance, dressing, cognition, language and psychosocial support 5. Can the patient actively participate in an intensive therapy program of at least 3 hrs of therapy per day at least 5 days per week? Yes 6. The potential for patient to make measurable gains while on inpatient rehab is fair 7. Anticipated functional outcomes upon discharge from inpatient rehab are supervision  with PT, supervision with OT, supervision and min assist with SLP. 8. Estimated rehab length of stay to reach the above functional goals is: 8-13 days. 9. Anticipated discharge destination: Home 10. Overall Rehab/Functional Prognosis: good  RECOMMENDATIONS: This patient's condition is appropriate for continued rehabilitative care in the following setting: Given cognitive dysfunction, recommend [facility]. Unfortunately, patient does not qualify for CIR. Patient has agreed to participate in recommended program. Potentially Note that insurance  prior authorization may be required for reimbursement for recommended care.  Comment: Rehab Admissions Coordinator to follow up.  I have personally performed a face to face diagnostic evaluation, including, but not limited to relevant history and physical exam findings, of this patient and developed relevant assessment and plan.  Additionally, I have reviewed and concur  with the physician assistant's documentation above.   Maryla Morrow, MD, ABPMR Mcarthur Rossetti Angiulli, PA-C 06/28/2020   >70 minutes spent in total in reviewing imaging, labs, records and counseling patient, girlfriend, mother regarding rehab options, recovery, inpatient rehab, etc.

## 2020-06-28 NOTE — Progress Notes (Signed)
Physical Therapy Treatment Patient Details Name: Matthew Horn MRN: [id number] DOB: 1981/02/18 Today's Date: 06/28/2020    History of Present Illness Pt is a 39 y/o male admitted after seizure. Pt fell and hit his head. Found to have shear injuries with intraparenchymal hemorrhage affecting both frontal regions, the left temporal tip, the inferolateral left temporal lobe, and both parieto-occipital regions. Pt also found to have anemia and received transfusion. Pt is s/p EGD that revealed erosive esophagitis and duodenal ulcers. Pt also with new alcoholic hepatitis. PMH includes alcohol abuse.     PT Comments    Patient progressing with ambulation without LOB today, but did almost run into the same computer in hallway x 2.  He was unable to recall the city, but knew he was in [place].  Girlfriend present during session and supportive.  Patient would benefit from outpatient therapy services and family education for improved safety and appropriate support at d/c.     Follow Up Recommendations  Outpatient PT     Equipment Recommendations  None recommended by PT    Recommendations for Other Services       Precautions / Restrictions Precautions Precautions: Fall Precaution Comments: Pt impulsive, unsteady    Mobility  Bed Mobility Overal bed mobility: Needs Assistance Bed Mobility: Supine to Sit     Supine to sit: Supervision     General bed mobility comments: moves unaided, but supervision for safety  Transfers Overall transfer level: Needs assistance Equipment used: None Transfers: Sit to/from Stand Sit to Stand: Supervision         General transfer comment: supervision for safety due to speed of movement and lack of awareness of deficits.   Ambulation/Gait Ambulation/Gait assistance: Supervision;Min guard Gait Distance (Feet): 1000 Feet Assistive device: None Gait Pattern/deviations: Step-through pattern;Drifts right/left     General Gait Details: no LOB but  minguard at times for safety due to proximity to obstacles without awareness until close; dual task walking while naming players on [facility] chiefs, counting down by 4's from 100   Stairs Stairs: Yes Stairs assistance: Min assist Stair Management: No rails;One rail Right;Forwards;Alternating pattern Number of Stairs: 10 (& more on stairs from second to first in atrium) General stair comments: no awareness of imbalance and does not reach for rails despite almost falling back off step due to not placing foot fully on step and missing a step going up two at a time x 1 min A for safety and balance   Wheelchair Mobility    Modified Rankin (Stroke Patients Only)       Balance Overall balance assessment: Independent   Sitting balance-Leahy Scale: Good       Standing balance-Leahy Scale: Good                              Cognition Arousal/Alertness: Awake/alert Behavior During Therapy: Impulsive Overall Cognitive Status: Impaired/Different from baseline Area of Impairment: Orientation;Attention;Memory;Safety/judgement;Following commands;Problem solving;Awareness;Rancho level               Rancho Levels of Cognitive Functioning [place] Mirant Scales of Cognitive Functioning: Confused/appropriate Orientation Level: Disoriented to;Place;Situation Current Attention Level: Sustained Memory: Decreased short-term memory;Decreased recall of precautions Following Commands: Follows multi-step commands with increased time Safety/Judgement: Decreased awareness of safety;Decreased awareness of deficits   Problem Solving: Slow processing        Exercises      General Comments General comments (skin integrity, edema, etc.): walked to atrium working on  path finding to piano in atrium max A to utilize map in hallway and redirected x 4 to piano and back to room; played with girlfriend on piano and practiced scales      Pertinent Vitals/Pain Pain Assessment: No/denies pain     Home Living                      Prior Function            PT Goals (current goals can now be found in the care plan section) Progress towards PT goals: Progressing toward goals    Frequency    Min 3X/week      PT Plan Current plan remains appropriate    Co-evaluation              AM-PAC PT "6 Clicks" Mobility   Outcome Measure  Help needed turning from your back to your side while in a flat bed without using bedrails?: None Help needed moving from lying on your back to sitting on the side of a flat bed without using bedrails?: None Help needed moving to and from a bed to a chair (including a wheelchair)?: None Help needed standing up from a chair using your arms (e.g., wheelchair or bedside chair)?: None Help needed to walk in hospital room?: A Little Help needed climbing 3-5 steps with a railing? : A Little 6 Click Score: 22    End of Session   Activity Tolerance: Patient tolerated treatment well Patient left: with family/visitor present   PT Visit Diagnosis: Other abnormalities of gait and mobility (R26.89);Other symptoms and signs involving the nervous system (R29.898)     Time: 1655-1730 PT Time Calculation (min) (ACUTE ONLY): 35 min  Charges:  $Gait Training: 8-22 mins $Therapeutic Activity: 8-22 mins                     Sheran Lawless, PT Acute Rehabilitation Services Pager:[phone] Office:[phone] 06/28/2020    Matthew Horn 06/28/2020, 9:11 PM

## 2020-06-29 LAB — BASIC METABOLIC PANEL
Anion gap: 8 (ref 5–15)
BUN: 6 mg/dL (ref 6–20)
CO2: 23 mmol/L (ref 22–32)
Calcium: 8.1 mg/dL — ABNORMAL LOW (ref 8.9–10.3)
Chloride: 106 mmol/L (ref 98–111)
Creatinine, Ser: 0.76 mg/dL (ref 0.61–1.24)
GFR calc Af Amer: >60 mL/min (ref >60)
GFR calc non Af Amer: >60 mL/min (ref >60)
Glucose, Bld: 80 mg/dL (ref 70–99)
Potassium: 3.7 mmol/L (ref 3.5–5.1)
Sodium: 137 mmol/L (ref 135–145)

## 2020-06-29 MED ORDER — POTASSIUM CHLORIDE CRYS ER 20 MEQ PO TBCR
40.0000 meq | EXTENDED_RELEASE_TABLET | Freq: Once | ORAL | Status: AC
  Administered 2020-06-29: 40 meq via ORAL
  Filled 2020-06-29: qty 2

## 2020-06-29 MED ORDER — LACTULOSE 10 GM/15ML PO SOLN
30.0000 g | Freq: Two times a day (BID) | ORAL | Status: DC
  Administered 2020-06-29 – 2020-06-30 (×2): 30 g via ORAL
  Filled 2020-06-29 (×3): qty 45

## 2020-06-29 NOTE — Progress Notes (Signed)
PROGRESS NOTE    Matthew Horn  NWG:[id number] DOB: 08-04-1981 DOA: 06/20/2020 PCP: Patient, No Pcp Per    Brief Narrative:  Patient admitted to the hospital with a working diagnosis of brain traumatic intraparenchymal hemorrhage following a fall,due to alcohol related seizure.Complicated by acute alcoholic hepatitis and hepatic encephalopathy..  39 year old male with past medical history for alcohol abuse. Patient was witnessed to have a generalized tonic-clonic seizure, while exiting a restaurant. Patient had 3 seizures on route to the hospital (received Versed). On his initial physical examination his blood pressure was 133/80, heart rate 138, temperature 99.5, respiratory rate of 14, oxygen saturation 100%. He had a laceration on his right hand, positive scleral icterus, his lungs are clear to auscultation bilaterally, heart S1-S2, present rhythm, his abdomen was soft, Glasgow Coma Scale was 15, confused, nonfocal. Sodium 135, potassium 3.6, chloride 101,bicarb 9, glucose 105, BUN 6, creatinine 1.14, AST 156, ALT 27, white count 11.8, hemoglobin 8.6, hematocrit 30.5, platelets 41, INR 1.7.SARS COVID-19 negative. Urinalysis specific gravity 1.016, 100 protein, 0-5 red cells, 0-5 white cells. Toxicology positive for alcohol 15, positive for benzodiazepines. Head CT with moderate volume acute intraparenchymal hemorrhage in the anterior aspect of the right frontal lobe extending local mass-effect. No significant midline shift. Additional small volume acute intraparenchymal hemorrhage in the anterior left temporal lobe and superior medial aspect of the right occipital lobe. Small volume associated with subarachnoid hemorrhage. Positive for motion artifact CT cervical spine, but no acute fracture. Chest radiograph with right basal atelectasis. EKG 151 bpm, normal axis, normal intervals, sinus rhythm, no significant ST segment or T wave changes.  Patient was admitted to the  intensive care unit, he was placed on Keppra for seizure control. CIWA protocol for alcohol withdrawal. Patient required dexmedetomidine due to agitation.  He was diagnosed with acute alcoholic hepatitis withhepatic encephalopathy, was placed on lactulose and rifaximin. Patient underwent upper endoscopy which showed mild esophageal varices.  Currently tolerating well medical therapy with lactulose, prednisone and lorazepam. Discontinued rifaximin, patient not able to afford this medication as outpatient.  Patient's cognitive infusion has been more stable, now pending inpatient rehab evaluation.     Assessment & Plan:   Principal Problem:   Intracerebral hemorrhage (HCC) Active Problems:   Alcohol withdrawal seizure with complication (HCC)   Alcoholic hepatitis   Cirrhosis (HCC)   Thrombocytopenia (HCC)   Duodenal ulcer   Acute blood loss anemia   Diffuse brain injury (HCC)   Tobacco abuse   Alcohol abuse   1. Acute traumatic intracranial hemorrhage, right frontal lobe, left temporal lobe, right occipital lobe/ complicated with seizures.  Patient tolerating well, medical regimen. His brother is at the bedside today.   Medical regimen with: keppra (seizure prophylaxis).   2. Alcohol intoxication withseizures.  Continue to improved in tremors. He has been getting lorazepam bid with good toleration. He has been calm and easy to redirected.  Will observe patient for the next 24 H, if continue to be stable will plan to discharge home in am. His family with travel back to [place], by car.   3. Acute alcoholic hepatitis/ chronic alcoholic liver cirrhosis (esophageal varices)/ acute on chronic liver failure.ammonia has been stable 26-37-27.  This am no frank asterixis.   On lactulose 30 mg tid, with improved encephalopathy, will change to bid dosing for now. Continue with prednisone to complete 28 days, continue with thiamine/ multivitamins.   4. Acute blood loss  anemia due to upper GI bleed/ duodenal ulcer.Sp 2 units PRBC  transfusion.  Continue with proton pump inhibition.  5. Thrombocytopeniadue to chronic liver failure. sp 2 pool of platelet transfusion. Plan for outpatient follow up.   6. Hypokalemia. K is 3,7 today, will add 40 kcl po.     Status is: Inpatient  Remains inpatient appropriate because:Inpatient level of care appropriate due to severity of illness   Dispo: The patient is from: Home              Anticipated d/c is to: Home              Anticipated d/c date is: 1 day              Patient currently is not medically stable to d/c.   DVT prophylaxis: scd patient is out of bed and ambulating   Code Status:   full  Family Communication:  I spoke with patient's brother at the bedside, we talked in detail about patient's condition, plan of care and prognosis and all questions were addressed.      Nutrition Status: Nutrition Problem: Increased nutrient needs Etiology: chronic illness (cirrhosis) Signs/Symptoms: estimated needs Interventions: MVI, Ensure Enlive (each supplement provides 350kcal and 20 grams of protein)     Consultants:  Neurology  Neurosurgery  Gastroenterology  Pulmonary critical care medicine  Procedures:  EEG  EGD  2 units of packed red blood cells  2 units of platelets  2 units of FFP   Subjective: Patient is more calm, no agitation. Positive bowel movements. Today is easy to redirect. His brother is at the bedside   Objective: Vitals:   06/28/20 0428 06/28/20 1530 06/28/20 2118 06/29/20 0525  BP: 140/88 (!) 150/103 (!) 143/92 (!) 147/98  Pulse: 74 84 74 72  Resp: 18 18 16 16   Temp: 98.6 F (37 C) 98.3 F (36.8 C) 98.8 F (37.1 C) 98.7 F (37.1 C)  TempSrc: Oral Oral Oral Oral  SpO2: 100% 96% 100% 99%  Weight:      Height:        Intake/Output Summary (Last 24 hours) at 06/29/2020 1053 Last data filed at 06/29/2020 08/29/2020 Gross per 24 hour  Intake 860 ml  Output --   Net 860 ml   Filed Weights   06/20/20 1400  Weight: 108.4 kg    Examination:  General: Not in pain or dyspnea  Neurology: Awake and alert, non focal. No significant tremors or asterixis  E ENT: mild pallor, mild icterus, oral mucosa moist Cardiovascular: No JVD. S1-S2 present, rhythmic, no gallops, rubs, or murmurs. No lower extremity edema. Pulmonary: positive breath sounds bilaterally, adequate air movement, no wheezing, rhonchi or rales. Gastrointestinal. Abdomen distended but not tender Skin. No rashes Musculoskeletal: no joint deformities     Data Reviewed: I have personally reviewed following labs and imaging studies  CBC: Recent Labs  Lab 06/23/20 0824 06/24/20 0616 06/26/20 1054 06/27/20 0111  WBC 5.2 4.6 6.7 5.1  NEUTROABS 3.1 2.5  --   --   HGB 8.2* 8.8* 9.2* 8.5*  HCT 25.9* 29.1* 29.0* 26.8*  MCV 84.1 86.1 84.5 83.8  PLT 72* 66* 144* 112*   Basic Metabolic Panel: Recent Labs  Lab 06/23/20 0824 06/23/20 0824 06/24/20 0616 06/25/20 0448 06/26/20 1054 06/27/20 0111 06/28/20 0421 06/29/20 0615  NA 137   < > 134*  --  134* 135 135 137  K 3.9   < > 3.7  --  3.0* 3.5 3.8 3.7  CL 106   < > 104  --  102  105 105 106  CO2 22   < > 22  --  22 22 22 23   GLUCOSE 107*   < > 114*  --  108* 113* 104* 80  BUN 8   < > 7  --  5* 7 6 6   CREATININE 0.96   < > 0.78  --  0.98 0.84 0.79 0.76  CALCIUM 7.5*   < > 7.8*  --  7.9* 7.9* 8.1* 8.1*  MG 1.7  --  1.6* 1.8  --   --   --   --   PHOS 2.9  --  3.2  --   --   --   --   --    < > = values in this interval not displayed.   GFR: Estimated Creatinine Clearance: 172.3 mL/min (by C-G formula based on SCr of 0.76 mg/dL). Liver Function Tests: Recent Labs  Lab 06/23/20 0824 06/24/20 0616 06/26/20 1054 06/27/20 0111  AST 84* 105* 129* 105*  ALT 23 27 38 34  ALKPHOS 104 126 128* 122  BILITOT 4.9* 4.7* 4.9* 3.5*  PROT 6.0* 5.8* 6.4* 5.7*  ALBUMIN 2.4* 2.5* 2.8* 2.5*   No results for input(s): LIPASE, AMYLASE in  the last 168 hours. Recent Labs  Lab 06/26/20 1054 06/27/20 1210 06/28/20 0421  AMMONIA 26 37* 27   Coagulation Profile: Recent Labs  Lab 06/23/20 0824 06/24/20 0616 06/26/20 1054 06/27/20 0111  INR 1.5* 1.4* 1.5* 1.6*   Cardiac Enzymes: No results for input(s): CKTOTAL, CKMB, CKMBINDEX, TROPONINI in the last 168 hours. BNP (last 3 results) No results for input(s): PROBNP in the last 8760 hours. HbA1C: No results for input(s): HGBA1C in the last 72 hours. CBG: Recent Labs  Lab 06/23/20 1647 06/23/20 1933 06/23/20 2315 06/24/20 0310 06/24/20 0738  GLUCAP 139* 145* 121* 130* 106*   Lipid Profile: No results for input(s): CHOL, HDL, LDLCALC, TRIG, CHOLHDL, LDLDIRECT in the last 72 hours. Thyroid Function Tests: No results for input(s): TSH, T4TOTAL, FREET4, T3FREE, THYROIDAB in the last 72 hours. Anemia Panel: No results for input(s): VITAMINB12, FOLATE, FERRITIN, TIBC, IRON, RETICCTPCT in the last 72 hours.    Radiology Studies: I have reviewed all of the imaging during this hospital visit personally     Scheduled Meds:  feeding supplement (ENSURE ENLIVE)  237 mL Oral BID BM   lactulose  30 g Oral Q8H   levETIRAcetam  500 mg Oral BID   LORazepam  1 mg Oral BID   multivitamins with iron  1 tablet Oral Daily   pantoprazole  40 mg Oral BID   predniSONE  40 mg Oral Q breakfast   thiamine  100 mg Oral Daily   Continuous Infusions:  sodium chloride       LOS: 9 days        Lynsay Fesperman 08/24/20, MD

## 2020-06-29 NOTE — Patient Instructions (Addendum)
Traumatic Brain Injury Traumatic brain injury (TBI) is an injury to the brain that results from:  A hard, direct blow to the head (closed injury).  An object penetrating the skull and entering the brain (open injury). Traumatic brain injury is also called a head injury or a concussion. TBI can be mild, moderate, or severe. What are the causes? Common causes of this condition include:  Falls.  Motor vehicle accidents.  Sports injuries.  Assaults. What increases the risk? You are more likely to develop this condition if you:  Are 75 years old or older.  Are a man.  Play contact sports, especially football, hockey, or soccer.  Are in the military.  Are a victim of violence.  Abuse drugs or alcohol.  Have had a previous TBI. What are the signs or symptoms? Symptoms may vary from person to person, and may include:  Loss of consciousness.  Headache.  Confusion.  Fatigue.  Changes in sleep.  Dizziness.  Mood or personality changes.  Memory problems.  Nausea or vomiting or both.  Seizures.  Clumsiness.  Slurred speech.  Depression and anxiety.  Anger.  Trouble concentrating, organizing, or making decisions.  Inability to control emotions or actions (impulse control).  Loss of or dulling of the senses, such as hearing, vision, and touch. This can include: ? Blurred vision. ? Ringing in your ears. How is this diagnosed? This condition may be diagnosed based on:  Medical history and physical exam.  Neurologic exam. This checks for brain and nervous system function, including your reflexes, memory, and coordination.  CT scan. Your TBI may be described as mild, moderate, or severe. How is this treated? Treatment depends on the severity of your brain injury and may include:  Breathing support (mechanical ventilation).  Blood pressure medicines.  Pain medicines.  Treatments to decrease the swelling in your brain.  Brain surgery. This may be  needed to: ? Remove a blood clot. ? Repair bleeding. ? Remove an object that has penetrated the brain, such as a skull fragment or a bullet. Treatment of TBI also includes:  Physical and mental rest.  Careful observation.  Medicine. You may be prescribed medicines to help with symptoms such as headaches, nausea, or difficulty sleeping.  Physical, occupational, and speech therapy.  Referral to a concussion clinic or rehabilitation center. Follow these instructions at home: Medicines  Take over-the-counter and prescription medicines only as told by your health care provider.  Do not take blood thinners (anticoagulants), aspirin, or other anti-inflammatory medicines such as ibuprofen or naproxen unless approved by your health care provider. Activity  Rest. Rest helps the brain to heal. Make sure you: ? Get plenty of sleep. Most adults should get 7-9 hours of sleep each night. ? Rest during the day. Take daytime naps or rest breaks when you feel tired.  Do not do high-risk activities that could cause a second concussion, such as riding a bike or playing sports. Having another concussion before the first one has healed can be dangerous.  Avoid a lot of visual stimulation. This includes work on the computer or phone, watching TV, and reading.  Ask your health care provider what kind of activities are safe for you. Your ability to react may be slower after a brain injury. Never do these activities if you are dizzy. Your health care provider will likely give you a plan for gradually returning to activities. General instructions  Do not drink alcohol.  Watch your symptoms and tell others to do the   same. Complications sometimes occur after a brain injury. Older adults with a brain injury may have a higher risk of serious complications.  Seek support from friends and family.  Keep all follow-up visits as directed by your health care provider. This is important. Contact a health care  provider if:  Your symptoms get worse or they do not improve.  You have new symptoms.  You have another injury. Get help right away if:  You have: ? Severe, persistent headaches that are not relieved by medicine. ? Weakness or numbness in any part of your body. ? Confusion. ? Slurred speech. ? Difficulty waking up. ? Nausea or persistent vomiting. ? A feeling like you are moving when you are not (vertigo). ? Seizures or you faint. ? Changes in your vision. ? Clear or bloody discharge from your nose or ears.  You cannot use your arms or legs normally. Summary  Traumatic brain injury happens when there is a hard, direct blow to the head or when an object penetrates the skull and enters the brain.  Traumatic brain injuries may be mild, moderate, or severe. Treatment depends on the severity of your injury.  Get help right away if you have a head injury and you develop seizures, confusion, vomiting, weakness in the arms or legs, slurred speech, and other symptoms.  Rest is one of the best treatments. Do not return to activity until your health care provider approves. This information is not intended to replace advice given to you by your health care provider. Make sure you discuss any questions you have with your health care provider. Document Revised: 02/11/2018 Document Reviewed: 11/24/2017 [employer] Patient Education  2020 [employer]  

## 2020-06-30 LAB — BASIC METABOLIC PANEL
Anion gap: 10 (ref 5–15)
BUN: 6 mg/dL (ref 6–20)
CO2: 22 mmol/L (ref 22–32)
Calcium: 7.9 mg/dL — ABNORMAL LOW (ref 8.9–10.3)
Chloride: 103 mmol/L (ref 98–111)
Creatinine, Ser: 0.71 mg/dL (ref 0.61–1.24)
GFR calc Af Amer: >60 mL/min (ref >60)
GFR calc non Af Amer: >60 mL/min (ref >60)
Glucose, Bld: 96 mg/dL (ref 70–99)
Potassium: 3.6 mmol/L (ref 3.5–5.1)
Sodium: 135 mmol/L (ref 135–145)

## 2020-06-30 MED ORDER — LACTULOSE 10 GM/15ML PO SOLN: 30.0000 g | Freq: Two times a day (BID) | ORAL | 0 refills | Status: AC

## 2020-06-30 MED ORDER — POTASSIUM CHLORIDE CRYS ER 20 MEQ PO TBCR
40.0000 meq | EXTENDED_RELEASE_TABLET | Freq: Once | ORAL | Status: AC
  Administered 2020-06-30: 40 meq via ORAL
  Filled 2020-06-30: qty 2

## 2020-06-30 MED ORDER — PREDNISONE 10 MG PO TABS: ORAL_TABLET | ORAL | 0 refills | Status: AC

## 2020-06-30 MED ORDER — LORAZEPAM 1 MG PO TABS: 1.0000 mg | ORAL_TABLET | Freq: Two times a day (BID) | ORAL | 0 refills | Status: AC | PRN

## 2020-06-30 MED ORDER — LORAZEPAM 1 MG PO TABS: 1.0000 mg | ORAL_TABLET | Freq: Two times a day (BID) | ORAL | 0 refills | Status: DC | PRN

## 2020-06-30 MED FILL — LACTULOSE 10 GM/15 ML SOLN: 10 | 2 days supply | Qty: 236 | Fill #0

## 2020-06-30 MED FILL — predniSONE 10 MG TABS: 10 | 28 days supply | Qty: 70 | Fill #0

## 2020-06-30 MED FILL — LORazepam 1 MG TABS: 1 | 5 days supply | Qty: 10 | Fill #0

## 2020-06-30 NOTE — Discharge Summary (Addendum)
Physician Discharge Summary  Matthew Horn YNW:[id number] DOB: [birth year] DOA: 06/20/2020  PCP: Patient, No Pcp Per  Admit date: 06/20/2020 Discharge date: 06/30/2020  Admitted From: home  Disposition:  Home   Recommendations for Outpatient Follow-up and new medication changes:  1. Follow up with Primary care in 7 days.  2. Patient has been placed on lactulose bid, titrate to 2 to 3 watery bowel movements per day.  3. Prednisone taper for acute alcoholic hepatitis.   Home Health: na   Equipment/Devices: na    Discharge Condition: stable  CODE STATUS: full  Diet recommendation: regular.   Brief/Interim Summary: Patient admitted to the hospital with a working diagnosis of brain traumatic injury with intraparenchymal hemorrhage following a fall,due to alcohol related seizure.Complicated by acute alcoholic hepatitis and hepatic encephalopathy..  39 year old male with past medical history for alcohol abuse. Patient was witnessed to have a generalized tonic-clonic seizure, while exiting a restaurant. Patient had 3 seizures on route to the hospital (received Versed). On his initial physical examination his blood pressure was 133/80, heart rate 138, temperature 99.5, respiratory rate of 14, oxygen saturation 100%. He had a laceration on his right hand, positive scleral icterus, his lungs were clear to auscultation bilaterally, heart S1-S2, present rhythmic, his abdomen was soft, Glasgow Coma Scale was 15, confused, nonfocal. Sodium 135, potassium 3.6, chloride 101,bicarb 9, glucose 105, BUN 6, creatinine 1.14, AST 156, ALT 27, white count 11.8, hemoglobin 8.6, hematocrit 30.5, platelets 41, INR 1.7.SARS COVID-19 negative. Urinalysis specific gravity 1.016, 100 protein, 0-5 red cells, 0-5 white cells. Toxicology positive for alcohol 15, positive for benzodiazepines.  Head CT with moderate volume acute intraparenchymal hemorrhage in the anterior aspect of the right frontal lobe  extending local mass-effect. No significant midline shift. Additional small volume acute intraparenchymal hemorrhage in the anterior left temporal lobe and superior medial aspect of the right occipital lobe. Small volume associated with subarachnoid hemorrhage. Positive for motion artifact CT cervical spine, but no acute fracture. Chest radiograph with right basal atelectasis. EKG 151 bpm, normal axis, normal intervals, sinus rhythm, no significant ST segment or T wave changes.  Patient was admitted to the intensive care unit, he was placed on Keppra for seizure control. CIWA protocol for alcohol withdrawal. Patient required dexmedetomidine due to agitation.  He was diagnosed with acute alcoholic hepatitis withhepatic encephalopathy, was placed on lactulose and rifaximin. Patient underwent upper endoscopy which showed mild esophageal varices.  Currently tolerating well medical therapy with lactulose, prednisone and lorazepam.Discontinuedrifaximin, patient not able to afford this medication as outpatient.  Patient's cognitive function has been more stable, did not qualify to inpatient rehab. He will be discharge home with close supervision from his family in [place].    1.  Acute traumatic intracranial hemorrhage, right frontal lobe, left temporal lobe, right occipital lobe, complicated with seizures.  Patient had close neurologic monitoring in the intensive care unit.  Patient was seen by neurosurgery, recommended follow-up head CT in 24 hours along with seizure prophylaxis.  Follow-up CT showed contusions, basal cisterns remain open, no hydro or mass-effect. Patient underwent electroencephalography which showed severe diffuse encephalopathy, nonspecific etiology.  No seizures or epileptiform discharges were seen throughout the recording.  Patient was placed on Keppra for seizure prophylaxis.  2.  Acute alcohol intoxication with seizures.  Patient received dexmedetomidine for  agitation.  Through his hospitalization withdrawal symptoms have improved.  Currently patient tolerating p.o. diet adequately, and he has been placed on lorazepam.  Patient will continue as needed lorazepam as  an outpatient, he will need to follow-up with outpatient social services for alcohol dependence.  3.  Acute alcoholic hepatitis, chronic alcoholic liver cirrhosis, esophageal varices, acute on chronic liver failure, hepatic encephalopathy.  Patient had hyperammonemia along with elevated INR and liver enzymes.  He was seen by gastroenterology.  For anemia he received 2 packed red blood cells, abdominal imaging showing splenic and gastric varices.  Per family report he did have hematemesis as an outpatient. Patient was placed on octreotide, ceftriaxone and underwent upper endoscopy, which showed grade B reflux esophagitis, grade 1 and small esophageal varices, portal hypertensive gastropathy, nonbleeding duodenal ulcers.  Patient has been placed on prednisone for alcoholic hepatitis and pantoprazole for gastritis.  3.  Acute blood loss anemia due to upper GI bleed, duodenal ulcers.  Patient received 2 packed red blood cell transfusion, underwent endoscopy as stated above.  Patient was discharged on pantoprazole, avoid further alcohol consumption.  4.  Thrombocytopenia, coagulopathy related to chronic liver failure.  Patient received to pull of platelets as well as fresh frozen plasma. His discharge platelet count is 112, and INR 1.6.  5.  Hypokalemia.  Likely related to GI losses, he received potassium chloride with good toleration.  His discharge potassium was 3.6, his kidney function is stable with a serum creatinine of 0.71, BUN 6, bicarb 22.   Discharge Diagnoses:  Principal Problem:   Intracerebral hemorrhage (HCC) Active Problems:   Alcohol withdrawal seizure with complication (HCC)   Alcoholic hepatitis   Cirrhosis (HCC)   Thrombocytopenia (HCC)   Duodenal ulcer   Acute blood  loss anemia   Diffuse brain injury (HCC)   Tobacco abuse   Alcohol abuse    Discharge Instructions  Discharge Instructions    Diet - low sodium heart healthy   Complete by: As directed    Diet - low sodium heart healthy   Complete by: As directed    Discharge instructions   Complete by: As directed    Follow up with primary care in 7 days.   Driving Restrictions   Complete by: As directed    None x 6 months   Increase activity slowly   Complete by: As directed    Increase activity slowly   Complete by: As directed    No wound care   Complete by: As directed    No wound care   Complete by: As directed      Allergies as of 06/30/2020   No Known Allergies     Medication List    TAKE these medications   lactulose 10 GM/15ML solution Commonly known as: CHRONULAC Take 45 mLs (30 g total) by mouth 2 (two) times daily.   levETIRAcetam 500 MG tablet Commonly known as: KEPPRA Take 1 tablet (500 mg total) by mouth 2 (two) times daily.   LORazepam 1 MG tablet Commonly known as: ATIVAN Take 1 tablet (1 mg total) by mouth 2 (two) times daily as needed for anxiety.   pantoprazole 40 MG tablet Commonly known as: PROTONIX Take 1 tablet (40 mg total) by mouth 2 (two) times daily. After this rx, will need 40 mg daily x 30 days   predniSONE 10 MG tablet Commonly known as: DELTASONE Take 4 tablets daily for one week, then take 3 tablets daily for one week, then take 2 tablets daily for one week, then take 1 tablet daily for one week.   thiamine 100 MG tablet Commonly known as: VITAMIN B-1 Take 1 tablet (100 mg total) by mouth  daily.       No Known Allergies  Consultations:  GI   Neurology   Neurosurgery    Procedures/Studies: CT ABDOMEN PELVIS WO CONTRAST  Result Date: 06/21/2020 CLINICAL DATA:  Evaluate for retroperitoneal hematoma.  Follow-up. EXAM: CT ABDOMEN AND PELVIS WITHOUT CONTRAST TECHNIQUE: Multidetector CT imaging of the abdomen and pelvis was  performed following the standard protocol without IV contrast. COMPARISON:  06/20/2020 FINDINGS: Lower chest: Bibasilar subsegmental atelectasis. Normal heart size. Tiny bilateral pleural effusions, new. Decreased density within the intravascular space, suggesting anemia. Hepatobiliary: Marked cirrhosis and heterogeneous hepatic steatosis. Gallbladder wall thickening again identified. No calcified stone or biliary duct dilatation. Pancreas: Normal, without mass or ductal dilatation. Spleen: Splenomegaly at 14.1 cm craniocaudal. Adrenals/Urinary Tract: Normal adrenal glands. Contrast within renal collecting systems from yesterday's CT. A lower pole left sided calyceal diverticulum on 53/4. Contrast within the urinary bladder. Stomach/Bowel: Normal stomach, without wall thickening. Ascending colonic wall thickening again identified, including on 57/4. Normal terminal ileum and appendix. Normal small bowel. Vascular/Lymphatic: Normal caliber of the aorta and branch vessels. Portal venous hypertension, with extensive perisplenic collaterals. No abdominopelvic adenopathy. Reproductive: Normal prostate. Other: Trace perihepatic ascites is unchanged. No retroperitoneal fluid or hemorrhage. Trace pelvic fluid, slightly decreased. Musculoskeletal: Mild bilateral gynecomastia. Bilateral L5 pars defects. Significantly age advanced lumbar spondylosis. IMPRESSION: 1. No evidence of retroperitoneal fluid or hemorrhage. 2. Cirrhosis and portal venous hypertension. 3. Similar trace perihepatic and slightly decreased pelvic ascites. 4. Similar ascending colonic wall thickening. Most likely secondary to portal venous hypertension and venous congestion. Infectious colitis cannot be excluded. 5. New tiny bilateral pleural effusions. 6. Mild bilateral gynecomastia. 7. Contrast within the intravascular space, suggesting anemia. 8. Lower pole left renal calyceal diverticulum. Electronically Signed   By: Jeronimo Greaves M.D.   On: 06/21/2020  12:24   CT HEAD WO CONTRAST  Result Date: 06/23/2020 CLINICAL DATA:  Follow-up intracranial hemorrhage. EXAM: CT HEAD WITHOUT CONTRAST TECHNIQUE: Contiguous axial images were obtained from the base of the skull through the vertex without intravenous contrast. COMPARISON:  Multiple recent exams, initially 06/20/2020. Most recent 06/21/2020. FINDINGS: Brain: No significant change since 2 days ago. Previous close head injury with initial trauma to the left posterior parietal region. Numerous shear injuries with intraparenchymal hemorrhage affecting both frontal regions, the left temporal tip, the inferolateral left temporal lobe, and both parieto-occipital regions. None of these hemorrhages appears to be enlarging at this point. Largest single hematoma is in the right frontal lobe measuring up to 4.1 cm. Amount of surrounding edema is unchanged. Very tiny amount of subdural blood along the left parietal convexity is becoming less visible. No more than 2 mm in thickness. No midline shift. No hydrocephalus. No evidence of ischemic infarction. Vascular: No abnormal vascular finding. Skull: No skull fracture. Sinuses/Orbits: No fluid in the sinuses.  Orbits negative. Other: None IMPRESSION: No significant change since 2 days ago. Numerous shear injuries with intraparenchymal hemorrhage affecting both frontal regions, the left temporal tip, the inferolateral left temporal lobe, and both parieto-occipital regions. None of these hemorrhages appears to be enlarging at this point. Amount of surrounding edema is unchanged. Very tiny amount of subdural blood along the left parietal convexity is becoming less visible, no more than 2 mm in thickness. Electronically Signed   By: Paulina Fusi M.D.   On: 06/23/2020 10:00   CT HEAD WO CONTRAST  Result Date: 06/21/2020 CLINICAL DATA:  Parenchymal hemorrhage, follow-up. EXAM: CT HEAD WITHOUT CONTRAST TECHNIQUE: Contiguous axial images were obtained from  the base of the skull  through the vertex without intravenous contrast. COMPARISON:  Head CT performed earlier the same day 06/21/2020, brain MRI 06/21/2020. FINDINGS: Brain: Numerous acute/early subacute parenchymal hemorrhages within the bilateral cerebral hemispheres have not significantly changed in size as compared to the head CT performed earlier the same day 06/21/2020. However, edema surrounding several hemorrhages may be subtly increased. As before, the largest parenchymal hemorrhages are present within the anterior right frontal lobe, within the anterior left temporal lobe and adjacent anterolateral left frontal lobe and within the right parietooccipital lobe. As before, there is regional mass effect associated with the hemorrhages but no midline shift or herniation. Vascular: No hyperdense vessel. Skull: Normal. Negative for fracture or focal lesion. Sinuses/Orbits: Visualized orbits show no acute finding. No significant paranasal sinus disease or mastoid effusion. Other: Redemonstrated large left posterior scalp hematoma with overlying scalp staples. IMPRESSION: Numerous acute/early subacute parenchymal hemorrhages within bilateral cerebral hemispheres have not significantly changed in size as compared to the head CT performed earlier the same day 06/21/2020. However, edema surrounding several of the hemorrhages may be subtly increased. As before, there is regional mass effect associated with the hemorrhages, but no midline shift or herniation. Redemonstrated large posterior left scalp hematoma with overlying scalp staples. Electronically Signed   By: Jackey Loge DO   On: 06/21/2020 12:31   CT HEAD WO CONTRAST  Result Date: 06/21/2020 CLINICAL DATA:  Hemorrhage follow-up EXAM: CT HEAD WITHOUT CONTRAST TECHNIQUE: Contiguous axial images were obtained from the base of the skull through the vertex without intravenous contrast. COMPARISON:  06/20/2020 FINDINGS: Brain: Marked worsening of multifocal intraparenchymal hemorrhage,  most evident in the left temporal lobe, left frontal operculum and right occipital lobe. There are numerous new hemorrhagic foci within the occipital lobes and posterior parietal lobes. There is no midline shift or other mass effect. Vascular: No hyperdense vessel or unexpected calcification. Skull: Large amount of left parietal scalp swelling Sinuses/Orbits: No acute finding. Other: None. IMPRESSION: 1. Worsening multifocal intraparenchymal hemorrhage, in addition to numerous new hemorrhagic sites. 2. No midline shift or other mass effect. These results were communicated to Dr. Ritta Slot at 12:49 am on 06/21/2020 by text page via the [facility] messaging system. Electronically Signed   By: Deatra Robinson M.D.   On: 06/21/2020 00:51   CT Head Wo Contrast  Result Date: 06/20/2020 CLINICAL DATA:  Seizure and head/cervical spine injury. EXAM: CT HEAD WITHOUT CONTRAST CT CERVICAL SPINE WITHOUT CONTRAST TECHNIQUE: Multidetector CT imaging of the head and cervical spine was performed following the standard protocol without intravenous contrast. Multiplanar CT image reconstructions of the cervical spine were also generated. COMPARISON:  None. FINDINGS: CT HEAD FINDINGS Brain: Moderate volume acute intraparenchymal hemorrhage in the anterior aspect of the right frontal lobe measures 2.8 x 2.8 (series 6, image 19) x 3.7 cm (series 4, image 17). This exerts local mass effect, however there is no significant midline shift. Additional small volume acute intraparenchymal hemorrhage is seen in the anterior left temporal lobe and superomedial aspect of the right occipital lobe, both with small volume associated subarachnoid hemorrhage. There is at least one focus of subarachnoid hemorrhage overlying the left occipital/parietal lobe (series 5, image 37). The ventricles are normal size, shape and morphology. Basilar cisterns are patent. Vascular: No hyperdense vessel or unexpected calcification. Skull: Normal. Negative for  fracture or focal lesion. Sinuses/Orbits: No acute finding. Other: There is a large scalp hematoma overlying the left parietal bone. CT CERVICAL SPINE FINDINGS Alignment: Normal. Skull  base and vertebrae: Motion artifact limits the evaluation for subtle fracture, however given this limitation no acute osseous injury is identified. No primary bone lesion or focal pathologic process. Soft tissues and spinal canal: No prevertebral fluid or swelling. No visible canal hematoma. Disc levels:  Multilevel degenerative disc disease. Upper chest: Negative. Other: None. IMPRESSION: 1. Moderate volume acute intraparenchymal hemorrhage in the anterior aspect of the right frontal lobe exerting local mass effect, however there is no significant midline shift. 2. Additional small volume acute intraparenchymal hemorrhage in the anterior left temporal lobe and superomedial aspect of the right occipital lobe, both with small volume associated subarachnoid hemorrhage. 3. Motion artifact limits the evaluation for subtle fracture in the cervical spine, however given this limitation no acute osseous injury is identified. These results were called by telephone at the time of interpretation on 06/20/2020 at 3:09 pm to provider [facility] [address] , who verbally acknowledged these results. Electronically Signed   By: Romona Curls M.D.   On: 06/20/2020 15:10   CT Cervical Spine Wo Contrast  Result Date: 06/20/2020 CLINICAL DATA:  Seizure and head/cervical spine injury. EXAM: CT HEAD WITHOUT CONTRAST CT CERVICAL SPINE WITHOUT CONTRAST TECHNIQUE: Multidetector CT imaging of the head and cervical spine was performed following the standard protocol without intravenous contrast. Multiplanar CT image reconstructions of the cervical spine were also generated. COMPARISON:  None. FINDINGS: CT HEAD FINDINGS Brain: Moderate volume acute intraparenchymal hemorrhage in the anterior aspect of the right frontal lobe measures 2.8 x 2.8 (series 6, image 19) x  3.7 cm (series 4, image 17). This exerts local mass effect, however there is no significant midline shift. Additional small volume acute intraparenchymal hemorrhage is seen in the anterior left temporal lobe and superomedial aspect of the right occipital lobe, both with small volume associated subarachnoid hemorrhage. There is at least one focus of subarachnoid hemorrhage overlying the left occipital/parietal lobe (series 5, image 37). The ventricles are normal size, shape and morphology. Basilar cisterns are patent. Vascular: No hyperdense vessel or unexpected calcification. Skull: Normal. Negative for fracture or focal lesion. Sinuses/Orbits: No acute finding. Other: There is a large scalp hematoma overlying the left parietal bone. CT CERVICAL SPINE FINDINGS Alignment: Normal. Skull base and vertebrae: Motion artifact limits the evaluation for subtle fracture, however given this limitation no acute osseous injury is identified. No primary bone lesion or focal pathologic process. Soft tissues and spinal canal: No prevertebral fluid or swelling. No visible canal hematoma. Disc levels:  Multilevel degenerative disc disease. Upper chest: Negative. Other: None. IMPRESSION: 1. Moderate volume acute intraparenchymal hemorrhage in the anterior aspect of the right frontal lobe exerting local mass effect, however there is no significant midline shift. 2. Additional small volume acute intraparenchymal hemorrhage in the anterior left temporal lobe and superomedial aspect of the right occipital lobe, both with small volume associated subarachnoid hemorrhage. 3. Motion artifact limits the evaluation for subtle fracture in the cervical spine, however given this limitation no acute osseous injury is identified. These results were called by telephone at the time of interpretation on 06/20/2020 at 3:09 pm to provider [facility] , who verbally acknowledged these results. Electronically Signed   By: Romona Curls M.D.   On:  06/20/2020 15:10   MR BRAIN WO CONTRAST  Result Date: 06/21/2020 CLINICAL DATA:  Seizure with head trauma EXAM: MRI HEAD WITHOUT CONTRAST TECHNIQUE: Multiplanar, multiecho pulse sequences of the brain and surrounding structures were obtained without intravenous contrast. COMPARISON:  Head CT 06/21/2020 FINDINGS: Brain: Numerous intraparenchymal  hemorrhages as demonstrated on the earlier head CT. The largest are located in the right frontal lobe and left temporal lobe. There is no midline shift. There is hyperintense T1-weighted signal of the globi pallidi. Moderate edema surrounding the hemorrhagic sites. No hydrocephalus. There is a hematocrit level within the right frontal pole emeritus. Susceptibility weighted imaging shows multiple hemorrhages better too small to be detected by CT. Normal midline structures. Vascular: Normal flow voids. Skull and upper cervical spine: Large left parietal scalp subgaleal hematoma. Sinuses/Orbits: Normal Other: None IMPRESSION: 1. Numerous intraparenchymal hemorrhages as demonstrated on the earlier head CT, in addition to multiple microhemorrhages too small to be detected by CT. 2. Hyperintense T1-weighted signal of the globi pallidi is compatible with manganese deposition, usually as a sequela of alcohol abuse. 3. No midline shift or hydrocephalus. 4. Large left parietal scalp subgaleal hematoma. Electronically Signed   By: Deatra Robinson M.D.   On: 06/21/2020 03:10   MR CERVICAL SPINE WO CONTRAST  Result Date: 06/21/2020 CLINICAL DATA:  Trauma.  Seizure. EXAM: MRI CERVICAL SPINE WITHOUT CONTRAST TECHNIQUE: Multiplanar, multisequence MR imaging of the cervical spine was performed. No intravenous contrast was administered. COMPARISON:  None. FINDINGS: Alignment: Straightening of normal cervical lordosis. Vertebrae: No fracture or acute signal abnormality. Cord: Normal Posterior Fossa, vertebral arteries, paraspinal tissues: Negative. Disc levels: C2-3: Normal. C3-4: Bilateral  uncovertebral hypertrophy with mild bilateral foraminal stenosis. No spinal canal stenosis. C4-5: Normal. C5-6: Normal. C6-7: Normal. C7-T1: Normal. IMPRESSION: 1. No acute abnormality of the cervical spine. 2. Mild bilateral C3-4 neural foraminal stenosis due to uncovertebral hypertrophy. Electronically Signed   By: Deatra Robinson M.D.   On: 06/21/2020 03:21   US Abdomen Complete  Result Date: 06/20/2020 CLINICAL DATA:  Cirrhosis. EXAM: ABDOMEN ULTRASOUND COMPLETE COMPARISON:  None. FINDINGS: Gallbladder: No gallstones are identified. The gallbladder wall is markedly thickened (1.73 cm). The patient is unresponsive. As a result, the presence or absence of a sonographic Eulah Pont sign could not be determined by the sonographer. Common bile duct: The common bile duct is not clearly visualized. Liver: No focal lesion identified. Diffusely increased echogenicity of the liver parenchyma is noted. No flow is seen within the portal vein on color Doppler imaging. IVC: Poorly visualized. Pancreas: Visualized portion unremarkable. Spleen: The spleen is enlarged (13.4 cm x 13.0 cm x 8.1 cm) with normal parenchymal echogenicity seen. Multiple varices are seen near the splenic hilum. Right Kidney: Length: 12.6 cm. Echogenicity within normal limits. No mass or hydronephrosis visualized. Left Kidney: Length: 12.6 cm. Echogenicity within normal limits. No mass or hydronephrosis visualized. Abdominal aorta: No aneurysm visualized. It should be noted that the distal aspect of the abdominal aorta and bifurcation are not clearly visualized. Other findings: A mild amount of ascites is noted. IMPRESSION: 1. Absent portal vein flow which may represent sequelae associated with portal venous thrombus. 2. Fatty liver. 3. Gallbladder wall thickening without evidence of cholelithiasis. 4. Splenomegaly. Electronically Signed   By: Aram Candela M.D.   On: 06/20/2020 17:51   CT ABDOMEN PELVIS W CONTRAST  Result Date: 06/20/2020 CLINICAL  DATA:  Cirrhosis, possible portal vein thrombosis, gallbladder wall thickening EXAM: CT ABDOMEN AND PELVIS WITH CONTRAST TECHNIQUE: Multidetector CT imaging of the abdomen and pelvis was performed using the standard protocol following bolus administration of intravenous contrast. CONTRAST:  OMNIPAQUE IOHEXOL 300 MG/ML  SOLN COMPARISON:  06/20/2020 FINDINGS: Lower chest: No acute pleural or parenchymal lung disease. Hepatobiliary: Diffuse heterogeneity of the liver parenchyma, with nodularity of the liver  capsule, compatible with known cirrhosis. No intrahepatic biliary duct dilation. Gallbladder is moderately distended, with pericholecystic fluid likely due to cirrhosis. No evidence of gallbladder wall thickening or calcified gallstones. Pancreas: Unremarkable. No pancreatic ductal dilatation or surrounding inflammatory changes. Spleen: Spleen is enlarged consistent with portal venous hypertension, measuring 16 cm in craniocaudal length. Adrenals/Urinary Tract: 3 mm nonobstructing calculus lower pole left kidney. Right kidney is unremarkable. The bladder is moderately distended with no filling defects. The adrenals are normal. Stomach/Bowel: No bowel obstruction or ileus. There is nonspecific wall thickening within a segment of the ascending colon near the hepatic flexure. This could be due to under distension, inflammation/infection, or wall thickening due to adjacent right upper quadrant free fluid. There is a normal appendix. Vascular/Lymphatic: The aorta is unremarkable without atherosclerosis. The portal vein, splenic vein, and superior mesenteric vein opacify normally. No evidence of portal venous thrombosis. There is evidence of significant portal venous hypertension, with splenomegaly and marked splenic and gastric varices. No pathologic adenopathy within the abdomen or pelvis. Reproductive: Prostate is unremarkable. Other: Small volume ascites greatest in the right upper quadrant and lower pelvis. No  free intraperitoneal gas. No abdominal wall hernia. Musculoskeletal: No acute or destructive bony lesions. Reconstructed images demonstrate no additional findings. IMPRESSION: 1. Normal opacification of the portal vein, SMV, and splenic vein. No evidence of portal vein thrombosis. 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 2. Cirrhosis.  No focal liver abnormality on this single phase exam. 3. Distended gallbladder, with pericholecystic free fluid likely due to underlying cirrhosis. No evidence of cholecystitis or cholelithiasis. 4. Small volume ascites. 5. Nonspecific wall thickening at the hepatic flexure of the colon, which could be due to inflammatory/infectious colitis, under distension, or wall thickening due to adjacent free fluid in the right upper quadrant. Electronically Signed   By: Sharlet SalinaMichael  Brown M.D.   On: 06/20/2020 21:53   DG Chest Port 1 View  Result Date: 06/22/2020 CLINICAL DATA:  Respiratory failure.  Hypoxia. EXAM: PORTABLE CHEST 1 VIEW COMPARISON:  06/21/2020. FINDINGS: Mediastinum and hilar structures normal. Stable cardiomegaly. No pulmonary venous congestion. Low lung volumes with bibasilar atelectasis again noted. No pleural effusion or pneumothorax. IMPRESSION: 1.  Stable cardiomegaly.  No pulmonary venous congestion. 2. Low lung volumes with bibasilar atelectasis again noted. Similar findings on prior exam. Electronically Signed   By: Maisie Fushomas  Register   On: 06/22/2020 05:48   DG Chest Port 1 View  Result Date: 06/21/2020 CLINICAL DATA:  Acute respiratory failure with hypoxia EXAM: PORTABLE CHEST 1 VIEW COMPARISON:  Portable exam 1110 hours without priors for comparison FINDINGS: Upper normal heart size. Mediastinal contours and pulmonary vascularity normal. Decreased lung volumes with mild RIGHT basilar atelectasis. No infiltrate, pleural effusion or pneumothorax. Osseous structures unremarkable. IMPRESSION: Decreased lung volumes  with RIGHT basilar atelectasis. Electronically Signed   By: Ulyses SouthwardMark  Boles M.D.   On: 06/21/2020 11:39   EEG adult  Result Date: 06/21/2020 Charlsie QuestYadav, Priyanka O, MD     06/21/2020  8:58 AM Patient Name: Matthew Horn MRN: [id number] Epilepsy Attending: Charlsie QuestPriyanka O Yadav Referring Physician/Provider: Dr. Thayer Headingsavi Agarwal Date: 06/20/1020 Duration: 25 minutes Patient history: 39 year old male with history of alcohol use had generalized tonic-clonic seizure.  CT head on arrival showed multiple traumatic subarachnoid contusions.  EEG to evaluate for seizures. Level of alertness: Comatose AEDs during EEG study: Keppra Technical aspects: This EEG study was done with scalp electrodes positioned according to the 10-20 International system of electrode placement. Electrical activity  was acquired at a sampling rate of 500Hz  and reviewed with a high frequency filter of 70Hz  and a low frequency filter of 1Hz . EEG data were recorded continuously and digitally stored. Description: EEG showed continuous generalized 2 to 3 Hz delta slowing with overriding 13 to 15 Hz generalized beta activity.  Hyperventilation and photic stimulation were not performed.   ABNORMALITY -Continuous slow, generalized -Excessive beta, generalized IMPRESSION: This study is suggestive of severe diffuse encephalopathy, nonspecific etiology but likely related to sedation.  No seizures or epileptiform discharges were seen throughout the recording. Priyanka      Procedures: EGD   Subjective: Patient is feeling better, he has been calm and cooperative. No clinical signs of intention to harm himself or others. Information from my observation and information from bedside nursing.   Discharge Exam: Vitals:   06/29/20 2203 06/30/20 0628  BP: (!) 148/81 135/82  Pulse: 65 (!) 59  Resp: 18 19  Temp: 98.1 F (36.7 C) 98.3 F (36.8 C)  SpO2: 100% 100%   Vitals:   06/29/20 0525 06/29/20 1419 06/29/20 2203 06/30/20 0628  BP: (!) 147/98 (!) 146/89  (!) 148/81 135/82  Pulse: 72 93 65 (!) 59  Resp: 16 16 18 19   Temp: 98.7 F (37.1 C) 98.4 F (36.9 C) 98.1 F (36.7 C) 98.3 F (36.8 C)  TempSrc: Oral Oral Oral Oral  SpO2: 99% 100% 100% 100%  Weight:      Height:        General: Not in pain or dyspnea.  Neurology: Awake and alert, non focal. No significant tremors or asterixis.  E ENT: no pallor, mild icterus, oral mucosa moist Cardiovascular: No JVD. S1-S2 present, rhythmic, no gallops, rubs, or murmurs. No lower extremity edema. Pulmonary:  positive breath sounds bilaterally Gastrointestinal. Abdomen mild distention Skin. No rashes Musculoskeletal: no joint deformities   The results of significant diagnostics from this hospitalization (including imaging, microbiology, ancillary and laboratory) are listed below for reference.     Microbiology: Recent Results (from the past 240 hour(s))  SARS Coronavirus 2 by RT PCR (hospital order, performed in [facility] hospital lab) Nasopharyngeal Nasopharyngeal Swab     Status: None   Collection Time: 06/20/20  1:44 PM   Specimen: Nasopharyngeal Swab  Result Value Ref Range Status   SARS Coronavirus 2 NEGATIVE NEGATIVE Final    Comment: (NOTE) SARS-CoV-2 target nucleic acids are NOT DETECTED.  The SARS-CoV-2 RNA is generally detectable in upper and lower respiratory specimens during the acute phase of infection. The lowest concentration of SARS-CoV-2 viral copies this assay can detect is 250 copies / mL. A negative result does not preclude SARS-CoV-2 infection and should not be used as the sole basis for treatment or other patient management decisions.  A negative result may occur with improper specimen collection / handling, submission of specimen other than nasopharyngeal swab, presence of viral mutation(s) within the areas targeted by this assay, and inadequate number of viral copies (<250 copies / mL). A negative result must be combined with clinical observations, patient  history, and epidemiological information.  Fact Sheet for Patients:   08/30/20  Fact Sheet for Healthcare Providers:  This test is not yet approved or  cleared by the [facility] FDA and has been authorized for detection and/or diagnosis of SARS-CoV-2 by FDA under an Emergency Use Authorization (EUA).  This EUA will remain in effect (meaning this test can be used) for the duration of the COVID-19 declaration under Section 564(b)(1) of  the Act, 21 U.S.C. section 360bbb-3(b)(1), unless the authorization is terminated or revoked sooner.  Performed at [facility], [address] [place], [place] [address]   MRSA PCR Screening     Status: None   Collection Time: 06/20/20  7:10 PM   Specimen: Nasopharyngeal  Result Value Ref Range Status   MRSA by PCR NEGATIVE NEGATIVE Final    Comment:        The GeneXpert MRSA Assay (FDA approved for NASAL specimens only), is one component of a comprehensive MRSA colonization surveillance program. It is not intended to diagnose MRSA infection nor to guide or monitor treatment for MRSA infections. Performed at [facility], [address] [place], [place] [address]   Urine Culture     Status: Abnormal   Collection Time: 06/21/20  2:14 PM   Specimen: Urine, Catheterized  Result Value Ref Range Status   Specimen Description URINE, CATHETERIZED  Final   Special Requests   Final    NONE Performed at [facility], [address], [place], [place] [address]    Culture >=100,000 COLONIES/mL STAPHYLOCOCCUS EPIDERMIDIS (A)  Final   Report Status 06/23/2020 FINAL  Final   Organism ID, Bacteria STAPHYLOCOCCUS EPIDERMIDIS (A)  Final      Susceptibility   Staphylococcus epidermidis - MIC*    CIPROFLOXACIN <=0.5 SENSITIVE Sensitive     GENTAMICIN <=0.5 SENSITIVE Sensitive     NITROFURANTOIN <=16 SENSITIVE Sensitive     OXACILLIN RESISTANT Resistant      TETRACYCLINE <=1 SENSITIVE Sensitive     VANCOMYCIN 1 SENSITIVE Sensitive     TRIMETH/SULFA <=10 SENSITIVE Sensitive     CLINDAMYCIN <=0.25 SENSITIVE Sensitive     RIFAMPIN <=0.5 SENSITIVE Sensitive     Inducible Clindamycin NEGATIVE Sensitive     * >=100,000 COLONIES/mL STAPHYLOCOCCUS EPIDERMIDIS  Culture, blood (Routine X 2) w Reflex to ID Panel     Status: None   Collection Time: 06/21/20  5:45 PM   Specimen: BLOOD RIGHT HAND  Result Value Ref Range Status   Specimen Description BLOOD RIGHT HAND  Final   Special Requests   Final    BOTTLES DRAWN AEROBIC AND ANAEROBIC Blood Culture adequate volume   Culture   Final    NO GROWTH 5 DAYS Performed at [facility], [address], [place], [place] [address]    Report Status 06/26/2020 FINAL  Final  Culture, blood (Routine X 2) w Reflex to ID Panel     Status: None   Collection Time: 06/21/20  5:51 PM   Specimen: BLOOD LEFT HAND  Result Value Ref Range Status   Specimen Description BLOOD LEFT HAND  Final   Special Requests   Final    BOTTLES DRAWN AEROBIC ONLY Blood Culture results may not be optimal due to an inadequate volume of blood received in culture bottles   Culture   Final    NO GROWTH 5 DAYS Performed at [facility], [address] [place], [place] [address]    Report Status 06/26/2020 FINAL  Final     Labs: BNP (last 3 results) No results for input(s): BNP in the last 8760 hours. Basic Metabolic Panel: Recent Labs  Lab 06/24/20 0616 06/24/20 0616 06/25/20 0448 06/26/20 1054 06/27/20 0111 06/28/20 0421 06/29/20 0615 06/30/20 0718  NA 134*   < >  --  134* 135 135 137 135  K 3.7   < >  --  3.0* 3.5 3.8 3.7  3.6  CL 104   < >  --  102 105 105 106 103  CO2 22   < >  --  22 22 22 23 22   GLUCOSE 114*   < >  --  108* 113* 104* 80 96  BUN 7   < >  --  5* 7 6 6 6   CREATININE 0.78   < >  --  0.98 0.84 0.79 0.76 0.71  CALCIUM 7.8*   < >  --  7.9* 7.9* 8.1* 8.1* 7.9*  MG 1.6*  --  1.8  --   --   --   --    --   PHOS 3.2  --   --   --   --   --   --   --    < > = values in this interval not displayed.   Liver Function Tests: Recent Labs  Lab 06/24/20 0616 06/26/20 1054 06/27/20 0111  AST 105* 129* 105*  ALT 27 38 34  ALKPHOS 126 128* 122  BILITOT 4.7* 4.9* 3.5*  PROT 5.8* 6.4* 5.7*  ALBUMIN 2.5* 2.8* 2.5*   No results for input(s): LIPASE, AMYLASE in the last 168 hours. Recent Labs  Lab 06/26/20 1054 06/27/20 1210 06/28/20 0421  AMMONIA 26 37* 27   CBC: Recent Labs  Lab 06/24/20 0616 06/26/20 1054 06/27/20 0111  WBC 4.6 6.7 5.1  NEUTROABS 2.5  --   --   HGB 8.8* 9.2* 8.5*  HCT 29.1* 29.0* 26.8*  MCV 86.1 84.5 83.8  PLT 66* 144* 112*   Cardiac Enzymes: No results for input(s): CKTOTAL, CKMB, CKMBINDEX, TROPONINI in the last 168 hours. BNP: Invalid input(s): POCBNP CBG: Recent Labs  Lab 06/23/20 1647 06/23/20 1933 06/23/20 2315 06/24/20 0310 06/24/20 0738  GLUCAP 139* 145* 121* 130* 106*   D-Dimer No results for input(s): DDIMER in the last 72 hours. Hgb A1c No results for input(s): HGBA1C in the last 72 hours. Lipid Profile No results for input(s): CHOL, HDL, LDLCALC, TRIG, CHOLHDL, LDLDIRECT in the last 72 hours. Thyroid function studies No results for input(s): TSH, T4TOTAL, T3FREE, THYROIDAB in the last 72 hours.  Invalid input(s): FREET3 Anemia work up No results for input(s): VITAMINB12, FOLATE, FERRITIN, TIBC, IRON, RETICCTPCT in the last 72 hours. Urinalysis    Component Value Date/Time   COLORURINE AMBER (A) 06/20/2020 1343   APPEARANCEUR HAZY (A) 06/20/2020 1343   LABSPEC 1.016 06/20/2020 1343   PHURINE 5.0 06/20/2020 1343   GLUCOSEU NEGATIVE 06/20/2020 1343   HGBUR MODERATE (A) 06/20/2020 1343   BILIRUBINUR NEGATIVE 06/20/2020 1343   KETONESUR NEGATIVE 06/20/2020 1343   PROTEINUR 100 (A) 06/20/2020 1343   NITRITE NEGATIVE 06/20/2020 1343   LEUKOCYTESUR NEGATIVE 06/20/2020 1343   Sepsis Labs Invalid input(s): PROCALCITONIN,  WBC,   LACTICIDVEN Microbiology Recent Results (from the past 240 hour(s))  SARS Coronavirus 2 by RT PCR (hospital order, performed in [facility] hospital lab) Nasopharyngeal Nasopharyngeal Swab     Status: None   Collection Time: 06/20/20  1:44 PM   Specimen: Nasopharyngeal Swab  Result Value Ref Range Status   SARS Coronavirus 2 NEGATIVE NEGATIVE Final    Comment: (NOTE) SARS-CoV-2 target nucleic acids are NOT DETECTED.  The SARS-CoV-2 RNA is generally detectable in upper and lower respiratory specimens during the acute phase of infection. The lowest concentration of SARS-CoV-2 viral copies this assay can detect is 250 copies / mL. A negative result does not preclude SARS-CoV-2 infection and should not be used  as the sole basis for treatment or other patient management decisions.  A negative result may occur with improper specimen collection / handling, submission of specimen other than nasopharyngeal swab, presence of viral mutation(s) within the areas targeted by this assay, and inadequate number of viral copies (<250 copies / mL). A negative result must be combined with clinical observations, patient history, and epidemiological information.  Fact Sheet for Patients:   BoilerBrush.com.cy  Fact Sheet for Healthcare Providers: https://pope.com/  This test is not yet approved or  cleared by the [place] FDA and has been authorized for detection and/or diagnosis of SARS-CoV-2 by FDA under an Emergency Use Authorization (EUA).  This EUA will remain in effect (meaning this test can be used) for the duration of the COVID-19 declaration under Section 564(b)(1) of the Act, 21 U.S.C. section 360bbb-3(b)(1), unless the authorization is terminated or revoked sooner.  Performed at [facility], [address] [place], [place] [address]   MRSA PCR Screening     Status: None   Collection Time: 06/20/20  7:10 PM   Specimen:  Nasopharyngeal  Result Value Ref Range Status   MRSA by PCR NEGATIVE NEGATIVE Final    Comment:        The GeneXpert MRSA Assay (FDA approved for NASAL specimens only), is one component of a comprehensive MRSA colonization surveillance program. It is not intended to diagnose MRSA infection nor to guide or monitor treatment for MRSA infections. Performed at [facility], [address] [place], [place] [address]   Urine Culture     Status: Abnormal   Collection Time: 06/21/20  2:14 PM   Specimen: Urine, Catheterized  Result Value Ref Range Status   Specimen Description URINE, CATHETERIZED  Final   Special Requests   Final    NONE Performed at [facility], [address] [place], [place] [address]    Culture >=100,000 COLONIES/mL STAPHYLOCOCCUS EPIDERMIDIS (A)  Final   Report Status 06/23/2020 FINAL  Final   Organism ID, Bacteria STAPHYLOCOCCUS EPIDERMIDIS (A)  Final      Susceptibility   Staphylococcus epidermidis - MIC*    CIPROFLOXACIN <=0.5 SENSITIVE Sensitive     GENTAMICIN <=0.5 SENSITIVE Sensitive     NITROFURANTOIN <=16 SENSITIVE Sensitive     OXACILLIN RESISTANT Resistant     TETRACYCLINE <=1 SENSITIVE Sensitive     VANCOMYCIN 1 SENSITIVE Sensitive     TRIMETH/SULFA <=10 SENSITIVE Sensitive     CLINDAMYCIN <=0.25 SENSITIVE Sensitive     RIFAMPIN <=0.5 SENSITIVE Sensitive     Inducible Clindamycin NEGATIVE Sensitive     * >=100,000 COLONIES/mL STAPHYLOCOCCUS EPIDERMIDIS  Culture, blood (Routine X 2) w Reflex to ID Panel     Status: None   Collection Time: 06/21/20  5:45 PM   Specimen: BLOOD RIGHT HAND  Result Value Ref Range Status   Specimen Description BLOOD RIGHT HAND  Final   Special Requests   Final    BOTTLES DRAWN AEROBIC AND ANAEROBIC Blood Culture adequate volume   Culture   Final    NO GROWTH 5 DAYS Performed at [facility], [address], [place], [place] [address]    Report Status 06/26/2020 FINAL  Final  Culture, blood  (Routine X 2) w Reflex to ID Panel     Status: None   Collection Time: 06/21/20  5:51 PM   Specimen: BLOOD LEFT HAND  Result Value Ref Range Status   Specimen Description BLOOD LEFT HAND  Final  Special Requests   Final    BOTTLES DRAWN AEROBIC ONLY Blood Culture results may not be optimal due to an inadequate volume of blood received in culture bottles   Culture   Final    NO GROWTH 5 DAYS Performed at [facility], [address] [place], [place] [address]    Report Status 06/26/2020 FINAL  Final     Time coordinating discharge: 45 minutes  SIGNED:   Coralie Keens, MD  [facility] 06/30/2020, 9:17 AM

## 2020-06-30 NOTE — Progress Notes (Signed)
Physical Therapy Treatment Patient Details Name: Matthew Horn MRN: [id number] DOB: 10-12-1981 Today's Date: 06/30/2020    History of Present Illness Pt is a 39 y/o male admitted after seizure. Pt fell and hit his head. Found to have shear injuries with intraparenchymal hemorrhage affecting both frontal regions, the left temporal tip, the inferolateral left temporal lobe, and both parieto-occipital regions. Pt also found to have anemia and received transfusion. Pt is s/p EGD that revealed erosive esophagitis and duodenal ulcers. Pt also with new alcoholic hepatitis. PMH includes alcohol abuse.     PT Comments    Patient progressing with mobility as stable for in room ambulation with distance supervision.  Discussed with mother one issue with mobility is safety on steps.  Also assisted with lots of education about TBI with booklet issued and heard her concerns and even called RN Admissions coordinator from [facility] to discuss if any alternatives.  She plans to take him first to her other son's home with his help, then to [place] where she lives.  Hope he gets some form of cognitive rehab once settled.     Follow Up Recommendations  Outpatient PT     Equipment Recommendations  None recommended by PT    Recommendations for Other Services       Precautions / Restrictions Precautions Precautions: Fall Precaution Comments: Pt impulsive    Mobility  Bed Mobility         Supine to sit: Independent        Transfers Overall transfer level: Independent     Sit to Stand: Supervision         General transfer comment: up and down from recliner in the room without assist safely, but impulsive  Ambulation/Gait Ambulation/Gait assistance: Supervision Gait Distance (Feet): 30 Feet Assistive device: None Gait Pattern/deviations: WFL(Within Functional Limits)     General Gait Details: in the room walking and pacing as eager for d/c while I discussed with mom issues related to  plans for d/c   Stairs         General stair comments: Educated mom pt needs assist for safety on steps to use railing and slow down due to fall risk   Wheelchair Mobility    Modified Rankin (Stroke Patients Only)       Balance Overall balance assessment: Needs assistance   Sitting balance-Leahy Scale: Normal     Standing balance support: No upper extremity supported Standing balance-Leahy Scale: Good                              Cognition Arousal/Alertness: Awake/alert Behavior During Therapy: Impulsive Overall Cognitive Status: Impaired/Different from baseline                 Rancho Levels of Cognitive Functioning Rancho Mirant Scales of Cognitive Functioning: Confused/inappropriate/non-agitated Orientation Level: Disoriented to;Place;Situation Current Attention Level: Sustained Memory: Decreased short-term memory;Decreased recall of precautions Following Commands: Follows multi-step commands with increased time Safety/Judgement: Decreased awareness of safety;Decreased awareness of deficits   Problem Solving: Slow processing General Comments: mother reports pt angry this morning, concerned about him trying to do things despite her reminding him he cannot, discussed ways to help with memory notebook, use of de-escalation techniques instead of reasoning, Issued TBI booklet and reviewed Ranchos Levels of recovery and discussed ways to avoid escalation with plenty of rest, food on schedule, meds, etc..      Exercises      General Comments  General comments (skin integrity, edema, etc.): Discussed with mom issues with follow up and her plans for driving to [place] with her other son to help and then to [place] by herself.  Asking RN appropriately to have printout of d/c instructions for pt to remind him what he cannot do.  Also educated to video RN to also have another back up for pt to see.  Mom reports already utilizing memory notebook.       Pertinent Vitals/Pain Pain Assessment: No/denies pain    Home Living                      Prior Function            PT Goals (current goals can now be found in the care plan section) Progress towards PT goals: Progressing toward goals    Frequency    Min 3X/week      PT Plan Current plan remains appropriate    Co-evaluation              AM-PAC PT "6 Clicks" Mobility   Outcome Measure  Help needed turning from your back to your side while in a flat bed without using bedrails?: None Help needed moving from lying on your back to sitting on the side of a flat bed without using bedrails?: None Help needed moving to and from a bed to a chair (including a wheelchair)?: None Help needed standing up from a chair using your arms (e.g., wheelchair or bedside chair)?: None Help needed to walk in hospital room?: None Help needed climbing 3-5 steps with a railing? : A Little 6 Click Score: 23    End of Session   Activity Tolerance: Patient tolerated treatment well Patient left: with family/visitor present;Other (comment);with nursing/sitter in room (standing in room)   PT Visit Diagnosis: Other abnormalities of gait and mobility (R26.89);Other symptoms and signs involving the nervous system (R29.898)     Time: 1050-1120 PT Time Calculation (min) (ACUTE ONLY): 30 min  Charges:  $Self Care/Home Management: 23-37                     Sheran Lawless, PT Acute Rehabilitation Services [phone] Office:[phone] 06/30/2020    [name] 06/30/2020, 1:24 PM

## 2020-06-30 NOTE — Progress Notes (Signed)
Pt is for DC today. [facility] pharmacy preparing Rxs. DC instructions reviewed with pt and mom.

## 2020-06-30 NOTE — Progress Notes (Signed)
Pt and mom discharged and had TOC meds and a extra Rx for Ativan for the trip home back to [place]. All meds reviewed and DC instructions given and reviewed.

## 2020-07-01 NOTE — Progress Notes (Signed)
Matthew Horn mother called today, they have arrived to [place] with no major complications. She mentioned that lactulose is running out.  Patient had intermittent confusion, but has improved with the lactulose.  I called in to the [facility] pharmacy at [place] a refill for lactulose, 2-week supply, I have increased the dose to 30 g 3 times daily.

## 2021-02-06 ENCOUNTER — Encounter: Admit: 2021-02-06 | Discharge: 2021-02-06

## 2021-02-06 NOTE — Progress Notes
40 yo male pt presented to [facility] after friend found him confused at home.  Pt is a known alcoholic and has been actively drinking up to admission.  EMS found the pt with a glucose in the 20s.  Was treated with D50.  After arrival in the ED the pt had what was though to be an alcohol withdrawal seizure that was treated with ativan.  Pt was admitted and has remained severely confused.  Since admission the pt's liver enzymes and kidney function have worsened.  + ascites.  Sbp in the 90s.  Pt has been given vitamin K 10 and 2 units of FFP due to elevated INR of 6.3.  Lactulose has been started and octreatide is being started.  Pt is on antibiotics for possible sbp.  Sending provider states that due to soft blood pressures, may start levophed.  No family is present and unclear on pt's pmh.  Thought to have HTN and known to have alcohol withdrawal seizures.  Pt remains confused but will answer yes and no questions.  Requesting transfer for higher level of care and hepatology care.  VS 90/40, HR 99, RR 20, 95% RA  Labs  Wbc 5.7  Hgb 7.8  Hct 23  Plt 33  INR 6.3  Na 125  K 3.7  Cl 88  Co2 4  Gap 23  Glu 82  Bun 12  Cr 3.9  Ca 6.3  Alb 2.2  Mg 1.1  Tbili 12  Ast 314  Alt 33  Alk phos 203  Lactic acid 6.7 (down from 9.5 on admission)  Ammonia 29

## 2021-02-18 DEATH — deceased
